# Patient Record
Sex: Male | Born: 1985 | Race: Black or African American | Hispanic: No | Marital: Single | Smoking: Heavy tobacco smoker
Health system: Southern US, Community
[De-identification: ages and names within clinical notes are randomized; demographics above are authoritative.]

## PROBLEM LIST (undated history)

## (undated) DIAGNOSIS — F1010 Alcohol abuse, uncomplicated: Secondary | ICD-10-CM

## (undated) DIAGNOSIS — F1910 Other psychoactive substance abuse, uncomplicated: Secondary | ICD-10-CM

## (undated) DIAGNOSIS — J45909 Unspecified asthma, uncomplicated: Secondary | ICD-10-CM

---
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
## 2008-09-03 ENCOUNTER — Emergency Department (HOSPITAL_COMMUNITY): Admission: EM | Admit: 2008-09-03 | Discharge: 2008-09-03 | Payer: Self-pay | Admitting: Emergency Medicine

## 2008-09-14 ENCOUNTER — Emergency Department (HOSPITAL_COMMUNITY): Admission: EM | Admit: 2008-09-14 | Discharge: 2008-09-14 | Payer: Self-pay | Admitting: Emergency Medicine

## 2010-03-17 ENCOUNTER — Emergency Department (HOSPITAL_COMMUNITY)
Admission: EM | Admit: 2010-03-17 | Discharge: 2010-03-17 | Payer: Self-pay | Source: Home / Self Care | Admitting: Emergency Medicine

## 2010-03-18 LAB — GC/CHLAMYDIA PROBE AMP, GENITAL
Chlamydia, DNA Probe: NEGATIVE
GC Probe Amp, Genital: NEGATIVE

## 2010-11-19 IMAGING — CR DG CHEST 2V
2 series · 2 of 2 positions shown · non-contrast
Comparison: None

CLINICAL DATA: Right chest pain with cough and congestion.  Smoker.
Question pneumonia.

CHEST - 2 VIEW

[view not recorded (1 of 2)]
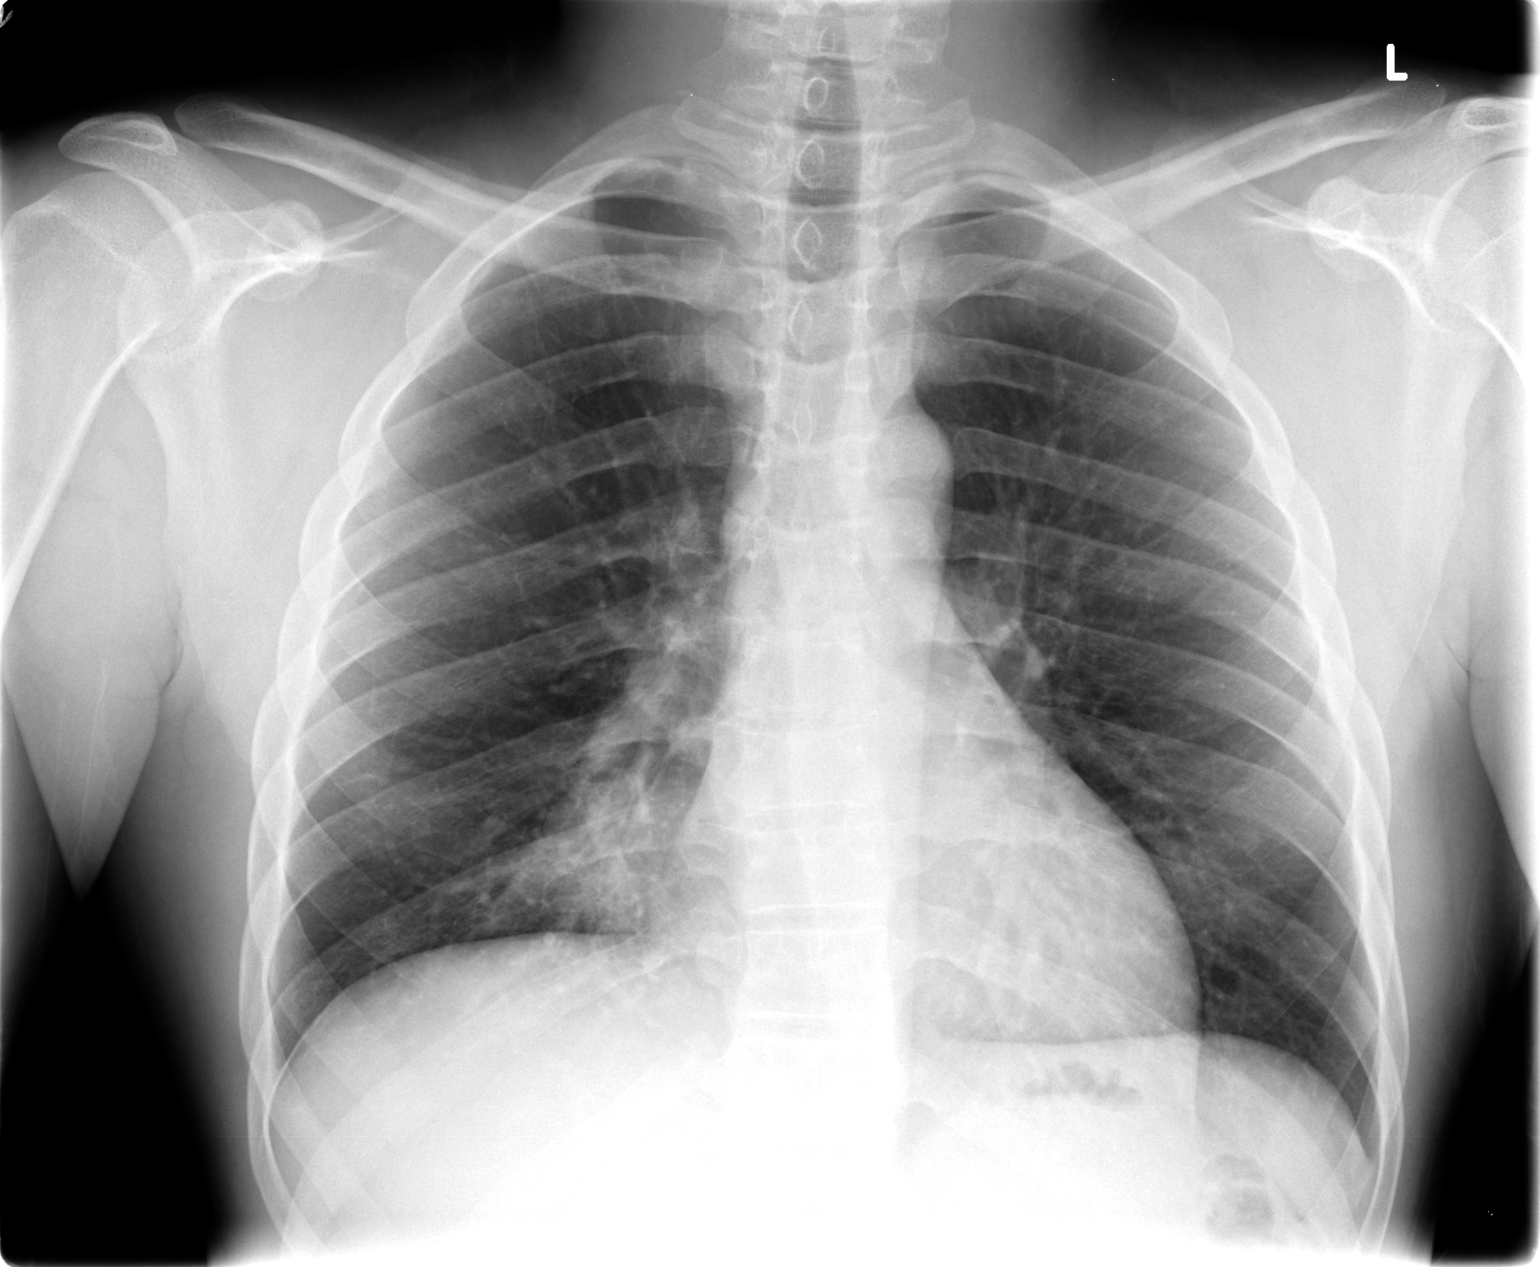

[view not recorded (2 of 2)]
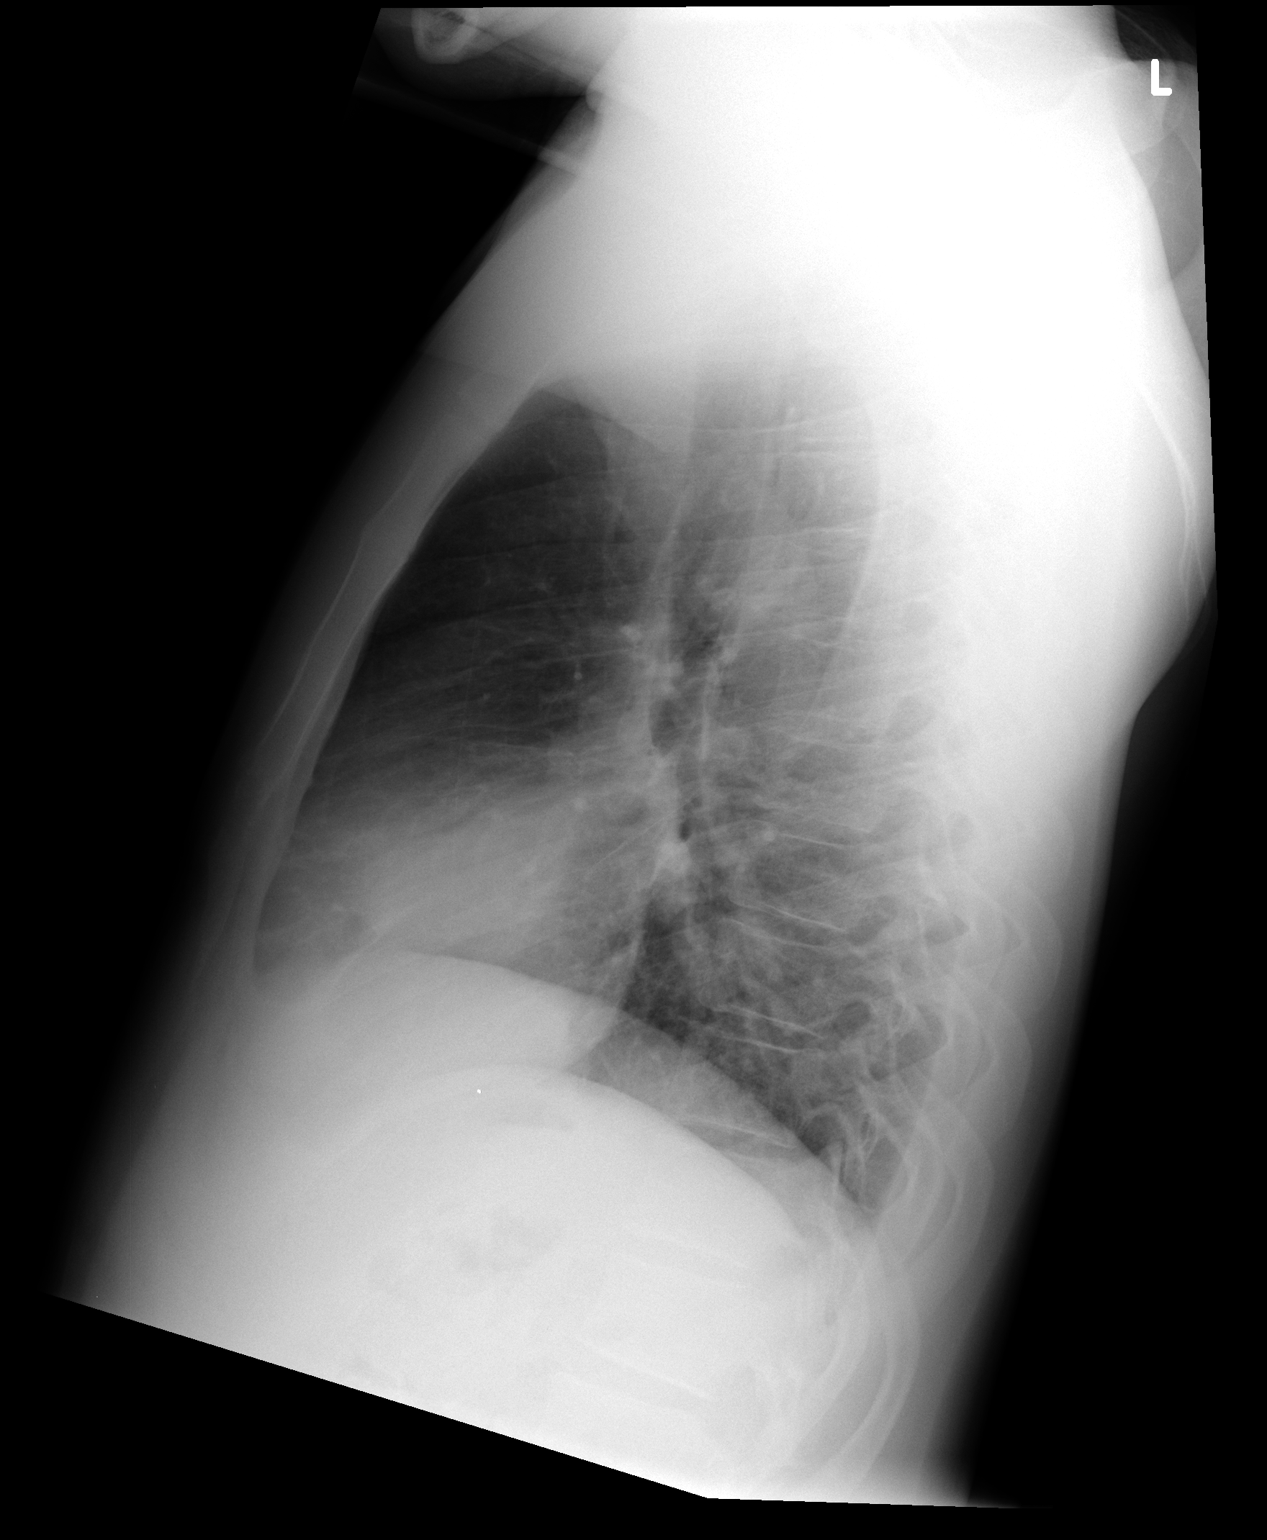

[2 of 2 positions shown; findings below may reference images not displayed]

FINDINGS: There is focal airspace disease or an ill-defined mass in
the right middle lobe, obscuring the right heart border on the
frontal examination.  The lungs are otherwise clear.  The heart
size and mediastinal contours otherwise appear normal.  There is no
pleural effusion or pneumothorax.
IMPRESSION: Probable right middle lobe pneumonia.  This should be followed to
radiographic clearing to exclude a mass or underlying lesion in
this location.

## 2019-02-16 ENCOUNTER — Emergency Department
Admission: EM | Admit: 2019-02-16 | Discharge: 2019-02-18 | Disposition: A | Discharge status: Other Acute Inpatient Hospital | Payer: Self-pay | Attending: Emergency Medicine | Admitting: Emergency Medicine

## 2019-02-16 ENCOUNTER — Other Ambulatory Visit: Payer: Self-pay

## 2019-02-16 DIAGNOSIS — R45851 Suicidal ideations: Secondary | ICD-10-CM | POA: Insufficient documentation

## 2019-02-16 DIAGNOSIS — F69 Unspecified disorder of adult personality and behavior: Secondary | ICD-10-CM

## 2019-02-16 DIAGNOSIS — R4689 Other symptoms and signs involving appearance and behavior: Secondary | ICD-10-CM | POA: Diagnosis present

## 2019-02-16 DIAGNOSIS — E876 Hypokalemia: Secondary | ICD-10-CM | POA: Insufficient documentation

## 2019-02-16 DIAGNOSIS — J45909 Unspecified asthma, uncomplicated: Secondary | ICD-10-CM | POA: Insufficient documentation

## 2019-02-16 DIAGNOSIS — F29 Unspecified psychosis not due to a substance or known physiological condition: Secondary | ICD-10-CM | POA: Diagnosis present

## 2019-02-16 DIAGNOSIS — F99 Mental disorder, not otherwise specified: Secondary | ICD-10-CM

## 2019-02-16 DIAGNOSIS — Z20828 Contact with and (suspected) exposure to other viral communicable diseases: Secondary | ICD-10-CM | POA: Insufficient documentation

## 2019-02-16 LAB — COMPREHENSIVE METABOLIC PANEL
ALT: 38 U/L (ref 0–44)
AST: 42 U/L — ABNORMAL HIGH (ref 15–41)
Albumin: 3.8 g/dL (ref 3.5–5.0)
Alkaline Phosphatase: 38 U/L (ref 38–126)
Anion gap: 14 (ref 5–15)
BUN: 10 mg/dL (ref 6–20)
CO2: 20 mmol/L — ABNORMAL LOW (ref 22–32)
Calcium: 9 mg/dL (ref 8.9–10.3)
Chloride: 108 mmol/L (ref 98–111)
Creatinine, Ser: 0.93 mg/dL (ref 0.61–1.24)
GFR calc Af Amer: >60 mL/min (ref >60)
GFR calc non Af Amer: >60 mL/min (ref >60)
Glucose, Bld: 99 mg/dL (ref 70–99)
Potassium: 3 mmol/L — ABNORMAL LOW (ref 3.5–5.1)
Sodium: 142 mmol/L (ref 135–145)
Total Bilirubin: 0.5 mg/dL (ref 0.3–1.2)
Total Protein: 6.9 g/dL (ref 6.5–8.1)

## 2019-02-16 LAB — CBC WITH DIFFERENTIAL/PLATELET
Abs Immature Granulocytes: 0.01 10*3/uL (ref 0.00–0.07)
Basophils Absolute: 0.1 10*3/uL (ref 0.0–0.1)
Basophils Relative: 1 %
Eosinophils Absolute: 0.2 10*3/uL (ref 0.0–0.5)
Eosinophils Relative: 4 %
HCT: 35.6 % — ABNORMAL LOW (ref 39.0–52.0)
Hemoglobin: 11.7 g/dL — ABNORMAL LOW (ref 13.0–17.0)
Immature Granulocytes: 0 %
Lymphocytes Relative: 46 %
Lymphs Abs: 2.4 10*3/uL (ref 0.7–4.0)
MCH: 26.8 pg (ref 26.0–34.0)
MCHC: 32.9 g/dL (ref 30.0–36.0)
MCV: 81.7 fL (ref 80.0–100.0)
Monocytes Absolute: 0.4 10*3/uL (ref 0.1–1.0)
Monocytes Relative: 9 %
Neutro Abs: 2 10*3/uL (ref 1.7–7.7)
Neutrophils Relative %: 40 %
Platelets: 194 10*3/uL (ref 150–400)
RBC: 4.36 MIL/uL (ref 4.22–5.81)
RDW: 14.3 % (ref 11.5–15.5)
WBC: 5.1 10*3/uL (ref 4.0–10.5)
nRBC: 0 % (ref 0.0–0.2)

## 2019-02-16 LAB — URINE DRUG SCREEN, QUALITATIVE (ARMC ONLY)
Amphetamines, Ur Screen: NOT DETECTED
Barbiturates, Ur Screen: NOT DETECTED
Benzodiazepine, Ur Scrn: NOT DETECTED
Cannabinoid 50 Ng, Ur ~~LOC~~: POSITIVE — AB
Cocaine Metabolite,Ur ~~LOC~~: POSITIVE — AB
MDMA (Ecstasy)Ur Screen: NOT DETECTED
Methadone Scn, Ur: NOT DETECTED
Opiate, Ur Screen: NOT DETECTED
Phencyclidine (PCP) Ur S: NOT DETECTED
Tricyclic, Ur Screen: NOT DETECTED

## 2019-02-16 LAB — SALICYLATE LEVEL: Salicylate Lvl: <7 mg/dL — ABNORMAL LOW (ref 7.0–30.0)

## 2019-02-16 LAB — ETHANOL: Alcohol, Ethyl (B): 160 mg/dL — ABNORMAL HIGH (ref <10)

## 2019-02-16 LAB — RESPIRATORY PANEL BY RT PCR (FLU A&B, COVID)
Influenza A by PCR: NEGATIVE
Influenza B by PCR: NEGATIVE
SARS Coronavirus 2 by RT PCR: NEGATIVE

## 2019-02-16 LAB — ACETAMINOPHEN LEVEL: Acetaminophen (Tylenol), Serum: <10 ug/mL — ABNORMAL LOW (ref 10–30)

## 2019-02-16 MED ORDER — POTASSIUM CHLORIDE CRYS ER 20 MEQ PO TBCR: 40.0000 meq | EXTENDED_RELEASE_TABLET | Freq: Once | ORAL | Status: DC

## 2019-02-16 MED ORDER — DIPHENHYDRAMINE HCL 50 MG/ML IJ SOLN
50.0000 mg | Freq: Once | INTRAMUSCULAR | Status: AC
  Administered 2019-02-16: 50 mg via INTRAVENOUS

## 2019-02-16 MED ORDER — LORAZEPAM 2 MG/ML IJ SOLN
2.0000 mg | Freq: Once | INTRAMUSCULAR | Status: AC
  Administered 2019-02-16: 2 mg via INTRAMUSCULAR

## 2019-02-16 MED ORDER — HALOPERIDOL LACTATE 5 MG/ML IJ SOLN: 5.0000 mg | Freq: Once | INTRAMUSCULAR | Status: AC

## 2019-02-16 MED ORDER — HALOPERIDOL LACTATE 5 MG/ML IJ SOLN
INTRAMUSCULAR | Status: AC
  Administered 2019-02-16: 5 mg via INTRAMUSCULAR
  Filled 2019-02-16: qty 1

## 2019-02-16 MED ORDER — LORAZEPAM 2 MG/ML IJ SOLN
INTRAMUSCULAR | Status: AC
  Administered 2019-02-16: 2 mg via INTRAMUSCULAR
  Filled 2019-02-16: qty 1

## 2019-02-16 MED ORDER — HALOPERIDOL LACTATE 5 MG/ML IJ SOLN
5.0000 mg | Freq: Once | INTRAMUSCULAR | Status: AC
  Administered 2019-02-16: 5 mg via INTRAMUSCULAR

## 2019-02-16 MED ORDER — LORAZEPAM 2 MG/ML IJ SOLN: 2.0000 mg | Freq: Once | INTRAMUSCULAR | Status: AC

## 2019-02-16 NOTE — ED Triage Notes (Signed)
Pt brought to rm 20 from registration desk with numerous officer and 2 relatives as pt is yelling and fighting. Threatening to kill everyone and himself. Pt family member tells this RN pt has not slept for days and has been "seeing things". Family unsure of hx of mental illness. Pt given medication in triage area prior to coming to rm 20. Dr Jari Pigg to room and ordered repeat of haldol and ativan  And will order restraints for pt safety.

## 2019-02-16 NOTE — ED Notes (Addendum)
Pt was released from wrist and ankle restraints by this tech per Lelon Frohlich, Therapist, sports.

## 2019-02-16 NOTE — ED Provider Notes (Signed)
Restraints were d/c from pt at 2030 but I forgot to delete the order. Deleted order now.    Vanessa Nolic, MD 02/16/19 2329

## 2019-02-16 NOTE — ED Notes (Addendum)
Pt dressed out by this tech and Colletta [place], NT-3. Pt belongings consisted of camo crocks, blue socks, white socks, necklace, hair bow, gray long sleeve shirt, white T-shirt, black jacket, blue underwear, gray pants.

## 2019-02-16 NOTE — ED Provider Notes (Signed)
[facility] Emergency Department Provider Note  ____________________________________________   First MD Initiated Contact with Patient 02/16/19 1905     (approximate)  I have reviewed the triage vital signs and the nursing notes.   HISTORY  Chief Complaint Aggressive Behavior    HPI Christian Fisher is a 33 y.o. male with no known medical history who comes in with psychiatric concern.  According to family patient is had some SI and HI.  Upon arrival here patient is agitated yelling and screaming.  Unable to get full HPI due to patient's agitation.  Per MOM:  Lost job, recent friend died, talking to self. FH of schizophrenia but he has never been diagnosed with anything. Does use THC and Cocaine. ETOH as well.  Medical: asthma   Prior to Admission medications   Not on File    Allergies Patient has no allergy information on record.  No family history on file.  Social History Cocaine, etoh, thc use.   Review of Systems Unable to get full review of system due to patient's agitation ________________________   PHYSICAL EXAM:  VITAL SIGNS: Blood pressure 125/75, pulse 71, temperature 99.1 F (37.3 C), temperature source Axillary, resp. rate 18, height 6\' 4"  (1.93 m), weight 104.3 kg, SpO2 97 %.   Constitutional: Yelling and screaming Eyes: Conjunctivae are normal.  No trauma Head: Atraumatic. Nose: No congestion/rhinnorhea. Mouth/Throat: Mucous membranes are moist.   Neck: No stridor. Trachea Midline. FROM Cardiovascular: Normal rate, regular rhythm. Grossly normal heart sounds.  Good peripheral circulation. Respiratory: Normal respiratory effort.  No retractions. Lungs CTAB. Gastrointestinal: Soft and nontender. No distention. No abdominal bruits.  Musculoskeletal: No lower extremity tenderness nor edema.  No joint effusions. Neurologic:  Normal speech and language. No gross focal neurologic deficits are appreciated.  Moving all  extremities Skin:  Skin is warm, dry and intact. No rash noted. Psychiatric: Yelling and screaming, unable to fully assess  GU: Deferred   ____________________________________________   LABS (all labs ordered are listed, but only abnormal results are displayed)  Labs Reviewed  CBC WITH DIFFERENTIAL/PLATELET  COMPREHENSIVE METABOLIC PANEL  URINE DRUG SCREEN, QUALITATIVE ([facility] ONLY)  ETHANOL  SALICYLATE LEVEL  ACETAMINOPHEN LEVEL   ____________________________________________   ED ECG REPORT I, , the attending physician, personally viewed and interpreted this ECG.  EKG is normal sinus rate of 91, no ST elevation, no T wave inversions, normal intervals ____________________________________________   INITIAL IMPRESSION / ASSESSMENT AND PLAN / ED COURSE  Christian Fisher was evaluated in Emergency Department on 02/16/2019 for the symptoms described in the history of present illness. He was evaluated in the context of the global COVID-19 pandemic, which necessitated consideration that the patient might be at risk for infection with the SARS-CoV-2 virus that causes COVID-19. Institutional protocols and algorithms that pertain to the evaluation of patients at risk for COVID-19 are in a state of rapid change based on information released by regulatory bodies including the CDC and federal and state organizations. These policies and algorithms were followed during the patient's care in the ED.    K low will give 40 PO.  IVC pt for concern for SI/HI/hallcunations.   Pt is without any acute medical complaints. No exam findings to suggest medical cause of current presentation. Will order psychiatric screening labs and discuss further w/ psychiatric service.  D/d includes but is not limited to psychiatric disease, behavioral/personality disorder, inadequate socioeconomic support, medical.  Based on HPI, exam, unremarkable labs, no concern for  acute medical problem at this time.  No rigidity, clonus, hyperthermia, focal neurologic deficit, diaphoresis, tachycardia, meningismus, ataxia, gait abnormality or other finding to suggest this visit represents a non-psychiatric problem. Screening labs reviewed.    Given this, pt medically cleared, to be dispositioned per Psych.        ____________________________________________   FINAL CLINICAL IMPRESSION(S) / ED DIAGNOSES   Final diagnoses:  Psychiatric complaint  Hypokalemia  Aggression      MEDICATIONS GIVEN DURING THIS VISIT:  Medications  potassium chloride SA (KLOR-CON) CR tablet 40 mEq (has no administration in time range)  LORazepam (ATIVAN) injection 2 mg (2 mg Intramuscular Given 02/16/19 1915)  haloperidol lactate (HALDOL) injection 5 mg (5 mg Intramuscular Given 02/16/19 1914)  haloperidol lactate (HALDOL) injection 5 mg (5 mg Intramuscular Given 02/16/19 1917)  LORazepam (ATIVAN) injection 2 mg (2 mg Intramuscular Given 02/16/19 1917)  diphenhydrAMINE (BENADRYL) injection 50 mg (50 mg Intravenous Given 02/16/19 1918)     ED Discharge Orders    None       Note:  This document was prepared using Dragon voice recognition software and may include unintentional dictation errors.   Vanessa [place], MD 02/16/19 2033

## 2019-02-16 NOTE — ED Notes (Signed)
Pt brought into ED by family. Pt family states that pt has not slept for day, he has been threatening to harm himself as well as others. Pt verbally and physically aggressive when entering ED. [employer] and security present. Pt was placed in handcuffs by [employer]. Pt continues to be verbally and physically aggressive trying to fight police and security. RN called for orders for medication and for a psych room. Verbal orders given by Dr. Joan Mayans for [place]. Pt was given injection of Benadryl 50 mg, Haldol 5 mg, and Ativan 2 mg IM by this RN and Teacher, English as a foreign language at [phone]. Pt continues to yell and cuss and scream at staff security, and [employer]. Pt family present with pt trying to calm pt and help get pt to psych room. Pt was met in room by Dr. Jari Pigg who IVC'ed pt.

## 2019-02-16 NOTE — BH Assessment (Signed)
[facility] Assessment Progress Note   Clinician unable to see patient at this time.  Pt given 5mg  haldol and 2mg  ativan at 19:15.  TTS to assess patient when he is alert and oriented.

## 2019-02-16 NOTE — ED Notes (Signed)
Pt up and ambulated to the restroom with two officers. Pt urinated all over the area around the toilet and in the toilet but this RN was able to catch urine in a cup for ordered UDS. Labeled and sent to the lab. Pt returned to the room and back to bed. Covered up and went to sleep.

## 2019-02-17 DIAGNOSIS — F29 Unspecified psychosis not due to a substance or known physiological condition: Secondary | ICD-10-CM | POA: Diagnosis present

## 2019-02-17 DIAGNOSIS — R4689 Other symptoms and signs involving appearance and behavior: Secondary | ICD-10-CM | POA: Diagnosis present

## 2019-02-17 MED ORDER — RISPERIDONE 1 MG PO TABS
1.0000 mg | ORAL_TABLET | Freq: Two times a day (BID) | ORAL | Status: DC
  Administered 2019-02-17 (×2): 1 mg via ORAL
  Filled 2019-02-17 (×2): qty 1

## 2019-02-17 MED ORDER — DIPHENHYDRAMINE HCL 50 MG/ML IJ SOLN: 50.0000 mg | Freq: Four times a day (QID) | INTRAMUSCULAR | Status: DC | PRN

## 2019-02-17 MED ORDER — DIPHENHYDRAMINE HCL 25 MG PO CAPS: 50.0000 mg | ORAL_CAPSULE | Freq: Four times a day (QID) | ORAL | Status: DC | PRN

## 2019-02-17 MED ORDER — LORAZEPAM 2 MG/ML IJ SOLN: 2.0000 mg | Freq: Four times a day (QID) | INTRAMUSCULAR | Status: DC | PRN

## 2019-02-17 MED ORDER — HALOPERIDOL 5 MG PO TABS: 5.0000 mg | ORAL_TABLET | Freq: Four times a day (QID) | ORAL | Status: DC | PRN

## 2019-02-17 MED ORDER — LORAZEPAM 2 MG PO TABS: 2.0000 mg | ORAL_TABLET | Freq: Four times a day (QID) | ORAL | Status: DC | PRN

## 2019-02-17 MED ORDER — HALOPERIDOL LACTATE 5 MG/ML IJ SOLN: 10.0000 mg | Freq: Four times a day (QID) | INTRAMUSCULAR | Status: DC | PRN

## 2019-02-17 NOTE — ED Notes (Signed)
Pt given meal tray.

## 2019-02-17 NOTE — ED Notes (Signed)
Hourly rounding reveals patient asleep in room. No complaints, stable, in no acute distress. Q15 minute rounds and monitoring via Security Cameras to continue. 

## 2019-02-17 NOTE — BH Assessment (Signed)
PATIENT CAN BE TRANSPORTED AFTER 9AM on 02/18/2019  Patient has been accepted to [facility].  Patient assigned to [place] [address] [place] is Dr. Merrie Roof.  Call report to [phone].  Representative was [employer].   ER Staff is aware of it:  [facility] ER Secretary  Dr. Heywood Iles , ER MD  Christian Fisher Patient's Nurse

## 2019-02-17 NOTE — [facility] Counselor (Signed)
Patient Referred to the following inpatient facilities...  [facility] [phone])  [facility] [phone])  [facility] [phone])  [facility] [phone])
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
## 2019-02-17 NOTE — Consult Note (Signed)
Provider unable to see patient at this time.  Pt given 5mg  haldol and 2mg  ativan at 19:15.  Psychiatry to assess patient when he is alert and oriented.

## 2019-02-17 NOTE — BH Assessment (Signed)
Additional Referral information for Psychiatric Hospitalization faxed to;   [facility] [phone]),   . [facility] [phone]- [phone]) [phone]  . [facility] [phone])  . [facility] [phone]),   . [facility] [phone] or [phone])  . [facility] [phone])   [facility] [phone] or [phone])   [place] [phone])

## 2019-02-17 NOTE — ED Notes (Signed)
Pt up independently to restroom 

## 2019-02-17 NOTE — ED Notes (Addendum)
Pt resting and not combative while changing clothes, drawing blood or doing vital signs. Dr Jari Pigg informed and agreeable with removing restraints. Will release restraints at this time. [facility] guards aware incase pt becomes aggressive again.

## 2019-02-17 NOTE — ED Notes (Signed)
Report to include Situation, Background, Assessment, and Recommendations received from Amy B. RN. Patient alert and oriented, warm and dry, in no acute distress. Patient denies SI, HI, AVH and pain. Patient made aware of Q15 minute rounds and security cameras for their safety. Patient instructed to come to me with needs or concerns. 

## 2019-02-17 NOTE — ED Provider Notes (Signed)
-----------------------------------------   7:17 AM on 02/17/2019 -----------------------------------------   Blood pressure (!) 100/54, pulse 77, temperature 98.9 F (37.2 C), temperature source Axillary, resp. rate 16, height 6\' 4"  (1.93 m), weight 104.3 kg, SpO2 100 %.  The patient is calm and cooperative at this time.  There have been no acute events since the last update.  Awaiting disposition plan from Behavioral Medicine and/or Social Work team(s).    Duffy Bruce, MD 02/17/19 [phone]

## 2019-02-17 NOTE — ED Notes (Signed)
Pt calm and cooperative on the unit. Pt allowed to use the phone.

## 2019-02-17 NOTE — ED Notes (Signed)
Pt does still have restraints at bedside in case needed when pt wakes up. Restraints NOT in use at this time.

## 2019-02-17 NOTE — ED Notes (Signed)
Hourly rounding reveals patient awake in room. No complaints, stable, in no acute distress. Q15 minute rounds and monitoring via Security Cameras to continue. 

## 2019-02-17 NOTE — ED Notes (Signed)
Psychiatry at bedside.

## 2019-02-17 NOTE — ED Notes (Signed)
Pt on phone with girlfriend at this time per Darnelle Maffucci, NP.

## 2019-02-17 NOTE — ED Notes (Signed)
TTS at bedside. Pt starts yelling and talking nonsense. 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 Pt walks to the bathroom but doesn't use the bathroom. Pt then goes back into the room and continues talking about his record deal. Pt then answer some questions appropriately and makes sense for a moment. Pt then lays back down and goes to sleep.

## 2019-02-17 NOTE — ED Notes (Signed)
Pt woke up said "good morning" and asked for pillows. Pt compliant with vitals. Pt given meal tray. Pt given two pillows. Pt calm and cooperative at this time.

## 2019-02-17 NOTE — BH Assessment (Signed)
Writer spoke with [place] Roderic Palau), currently reviewing patient.

## 2019-02-18 NOTE — ED Notes (Signed)
Hourly rounding reveals patient sleeping in room. No complaints, stable, in no acute distress. Q15 minute rounds and monitoring via Security Cameras to continue. 

## 2019-02-18 NOTE — ED Notes (Signed)
Pt discharged under IVC to [facility].  Report given to Durene Fruits., RN. VS stable. Pt denies SI/HI. All belongings given to officer.  Pt accepting of disposition.

## 2019-02-18 NOTE — ED Notes (Signed)
Pt told he would be leaving for [facility] for [facility].    Pt unhappy, but agreed to go with the [facility] officer.  No behavioral outbursts. Compliant with VS.

## 2019-02-18 NOTE — ED Provider Notes (Signed)
-----------------------------------------   4:37 AM on 02/18/2019 -----------------------------------------   Blood pressure 116/82, pulse 71, temperature 98.2 F (36.8 C), temperature source Oral, resp. rate 17, height 6\' 4"  (1.93 m), weight 104.3 kg, SpO2 99 %.  The patient had no acute events since last update.  Calm and cooperative at this time.  Disposition is pending per Psychiatry/Behavioral Medicine team recommendations.     Rudene Re, MD 02/18/19 [phone]

## 2019-03-06 ENCOUNTER — Other Ambulatory Visit: Payer: Self-pay

## 2019-03-06 DIAGNOSIS — F29 Unspecified psychosis not due to a substance or known physiological condition: Secondary | ICD-10-CM | POA: Insufficient documentation

## 2019-03-06 DIAGNOSIS — F17200 Nicotine dependence, unspecified, uncomplicated: Secondary | ICD-10-CM | POA: Insufficient documentation

## 2019-03-06 DIAGNOSIS — F1010 Alcohol abuse, uncomplicated: Secondary | ICD-10-CM | POA: Insufficient documentation

## 2019-03-06 DIAGNOSIS — Y906 Blood alcohol level of 120-199 mg/100 ml: Secondary | ICD-10-CM | POA: Insufficient documentation

## 2019-03-06 LAB — CBC
HCT: 37.9 % — ABNORMAL LOW (ref 39.0–52.0)
Hemoglobin: 12.7 g/dL — ABNORMAL LOW (ref 13.0–17.0)
MCH: 27.1 pg (ref 26.0–34.0)
MCHC: 33.5 g/dL (ref 30.0–36.0)
MCV: 80.8 fL (ref 80.0–100.0)
Platelets: 280 10*3/uL (ref 150–400)
RBC: 4.69 MIL/uL (ref 4.22–5.81)
RDW: 14.3 % (ref 11.5–15.5)
WBC: 10.6 10*3/uL — ABNORMAL HIGH (ref 4.0–10.5)
nRBC: 0 % (ref 0.0–0.2)

## 2019-03-06 LAB — COMPREHENSIVE METABOLIC PANEL
ALT: 19 U/L (ref 0–44)
AST: 24 U/L (ref 15–41)
Albumin: 4.5 g/dL (ref 3.5–5.0)
Alkaline Phosphatase: 52 U/L (ref 38–126)
Anion gap: 12 (ref 5–15)
BUN: 16 mg/dL (ref 6–20)
CO2: 22 mmol/L (ref 22–32)
Calcium: 9.3 mg/dL (ref 8.9–10.3)
Chloride: 104 mmol/L (ref 98–111)
Creatinine, Ser: 0.93 mg/dL (ref 0.61–1.24)
GFR calc Af Amer: >60 mL/min (ref >60)
GFR calc non Af Amer: >60 mL/min (ref >60)
Glucose, Bld: 101 mg/dL — ABNORMAL HIGH (ref 70–99)
Potassium: 3.8 mmol/L (ref 3.5–5.1)
Sodium: 138 mmol/L (ref 135–145)
Total Bilirubin: 0.6 mg/dL (ref 0.3–1.2)
Total Protein: 8.1 g/dL (ref 6.5–8.1)

## 2019-03-06 LAB — ACETAMINOPHEN LEVEL: Acetaminophen (Tylenol), Serum: <10 ug/mL — ABNORMAL LOW (ref 10–30)

## 2019-03-06 LAB — ETHANOL: Alcohol, Ethyl (B): 194 mg/dL — ABNORMAL HIGH (ref <10)

## 2019-03-06 LAB — SALICYLATE LEVEL: Salicylate Lvl: <7 mg/dL — ABNORMAL LOW (ref 7.0–30.0)

## 2019-03-06 NOTE — ED Triage Notes (Signed)
Pt presents via [facility] custody. Pt under IVC by family. Reports "people out to get him".

## 2019-03-07 ENCOUNTER — Emergency Department
Admission: EM | Admit: 2019-03-07 | Discharge: 2019-03-07 | Disposition: A | Discharge status: Home / Self Care | Payer: Self-pay | Attending: Student in an Organized Health Care Education/Training Program | Admitting: Student in an Organized Health Care Education/Training Program

## 2019-03-07 DIAGNOSIS — R4689 Other symptoms and signs involving appearance and behavior: Secondary | ICD-10-CM

## 2019-03-07 DIAGNOSIS — F319 Bipolar disorder, unspecified: Secondary | ICD-10-CM | POA: Insufficient documentation

## 2019-03-07 MED ORDER — LORAZEPAM 1 MG PO TABS
1.0000 mg | ORAL_TABLET | Freq: Once | ORAL | Status: AC
  Administered 2019-03-07: 1 mg via ORAL
  Filled 2019-03-07: qty 1

## 2019-03-07 NOTE — Consult Note (Signed)
[facility] Face-to-Face Psychiatry Consult   Reason for Consult:  Bipolar disorder Referring Physician: Siadecki Patient Identification: Christian Fisher MRN:  [id number] Principal Diagnosis: <principal problem not specified> Diagnosis:  Active Problems:   * No active hospital problems. *   Total Time spent with patient: 30 minutes  Subjective:   Christian Fisher is a 34 y.o. male patient who presented to the emergency department for reported abnormal behavior.  HPI:  Christian Fisher is a 34 y.o. male who presented to the emergency department last night. Upon arrival the patient was upset about his situation and felt that he was placed under IVC for no good reason. He was offered medication to help him relax, which he took. He was left overnight to sleep and was reassessed this morning. Upon speaking with him this morning he continues to express that his IVC was done unnecessarily. He adamantly denies any symptoms. He denies SI and HI, hallucinations, mania, or symptoms of psychosis. He expresses that he would like to go home. Goes on to say further that he was just released from the psych hospital approximately 2 weeks prior.  He states that he was at [facility] for what he believed to be bipolar disorder and was prescribed medications and set up with an outpatient appointment.  Patient states that he was not prescribed mood stabilizers and only benzos to help with his anxiety.  He goes on to further say that he was completely stabilized at the time of his discharge and is feeling more stable now.  He is unsure why his aunts would have called IVC on him but he thinks it has to do with him not answering his phone.  Patient states that he often does not answer his phone because he is busy with other tasks.  He denies any concerns about his mental health and reports feeling safe going home today.  Past Psychiatric History: Patient reports recent inpatient psychiatric hospitalization where he was  diagnosed with bipolar disorder. He received treatment and was referred for outpatient services. He has been compliant with his medications and follow up since discharge and will follow up with [facility] to refill his medications.   Risk to Self: Suicidal Ideation: No Suicidal Intent: No Is patient at risk for suicide?: No, but patient needs Medical Clearance Suicidal Plan?: No Access to Means: No How many times?: 0 Risk to Others: Homicidal Ideation: No Thoughts of Harm to Others: No Current Homicidal Intent: No Current Homicidal Plan: No Access to Homicidal Means: No History of harm to others?: No Assessment of Violence: None Noted Does patient have access to weapons?: No Criminal Charges Pending?: No Does patient have a court date: No Prior Inpatient Therapy: Prior Inpatient Therapy: No Prior Outpatient Therapy: Prior Outpatient Therapy: No Does patient have an ACCT team?: No Does patient have Intensive In-House Services?  : No Does patient have [facility] services? : No Does patient have P4CC services?: No  Past Medical History: History reviewed. No pertinent past medical history. History reviewed. No pertinent surgical history. Family History: History reviewed. No pertinent family history. Family Psychiatric  History: Unknown Social History:  Social History   Substance and Sexual Activity  Alcohol Use None     Social History   Substance and Sexual Activity  Drug Use Not on file    Social History   Socioeconomic History  . Marital status: Single    Spouse name: Not on file  . Number of children: Not on file  . Years of  education: Not on file  . Highest education level: Not on file  Occupational History  . Not on file  Tobacco Use  . Smoking status: Heavy Tobacco Smoker  . Smokeless tobacco: Never Used  Substance and Sexual Activity  . Alcohol use: Not on file  . Drug use: Not on file  . Sexual activity: Not on file  Other Topics Concern  . Not on file  Social  History Narrative  . Not on file   Social Determinants of Health   Financial Resource Strain:   . Difficulty of Paying Living Expenses: Not on file  Food Insecurity:   . Worried About Charity fundraiser in the Last Year: Not on file  . Ran Out of Food in the Last Year: Not on file  Transportation Needs:   . Lack of Transportation (Medical): Not on file  . Lack of Transportation (Non-Medical): Not on file  Physical Activity:   . Days of Exercise per Week: Not on file  . Minutes of Exercise per Session: Not on file  Stress:   . Feeling of Stress : Not on file  Social Connections:   . Frequency of Communication with Friends and Family: Not on file  . Frequency of Social Gatherings with Friends and Family: Not on file  . Attends Religious Services: Not on file  . Active Member of Clubs or Organizations: Not on file  . Attends Archivist Meetings: Not on file  . Marital Status: Not on file   Additional Social History: Lives with his girlfriend. Works in Biomedical engineer. Recently out of prison.     Allergies:  No Known Allergies  Labs:  Results for orders placed or performed during the hospital encounter of 03/07/19 (from the past 48 hour(s))  Comprehensive metabolic panel     Status: Abnormal   Collection Time: 03/06/19 10:02 PM  Result Value Ref Range   Sodium 138 135 - 145 mmol/L   Potassium 3.8 3.5 - 5.1 mmol/L   Chloride 104 98 - 111 mmol/L   CO2 22 22 - 32 mmol/L   Glucose, Bld 101 (H) 70 - 99 mg/dL   BUN 16 6 - 20 mg/dL   Creatinine, Ser 0.93 0.61 - 1.24 mg/dL   Calcium 9.3 8.9 - 10.3 mg/dL   Total Protein 8.1 6.5 - 8.1 g/dL   Albumin 4.5 3.5 - 5.0 g/dL   AST 24 15 - 41 U/L   ALT 19 0 - 44 U/L   Alkaline Phosphatase 52 38 - 126 U/L   Total Bilirubin 0.6 0.3 - 1.2 mg/dL   GFR calc non Af Amer >60 >60 mL/min   GFR calc Af Amer >60 >60 mL/min   Anion gap 12 5 - 15    Comment: Performed at [facility], [address], [place], [place] [address]   Ethanol     Status: Abnormal   Collection Time: 03/06/19 10:02 PM  Result Value Ref Range   Alcohol, Ethyl (B) 194 (H) <10 mg/dL    Comment: (NOTE) Lowest detectable limit for serum alcohol is 10 mg/dL. For medical purposes only. Performed at [facility], [address] [place], [place] [address]   Salicylate level     Status: Abnormal   Collection Time: 03/06/19 10:02 PM  Result Value Ref Range   Salicylate Lvl <8.2 (L) 7.0 - 30.0 mg/dL    Comment: Performed at [facility], [address] [place], [place] [address]  Acetaminophen level  Status: Abnormal   Collection Time: 03/06/19 10:02 PM  Result Value Ref Range   Acetaminophen (Tylenol), Serum <10 (L) 10 - 30 ug/mL    Comment: (NOTE) Therapeutic concentrations vary significantly. A range of 10-30 ug/mL  may be an effective concentration for many patients. However, some  are best treated at concentrations outside of this range. Acetaminophen concentrations >150 ug/mL at 4 hours after ingestion  and >50 ug/mL at 12 hours after ingestion are often associated with  toxic reactions. Performed at [facility], [address] [place], [place] [address]   cbc     Status: Abnormal   Collection Time: 03/06/19 10:02 PM  Result Value Ref Range   WBC 10.6 (H) 4.0 - 10.5 K/uL   RBC 4.69 4.22 - 5.81 MIL/uL   Hemoglobin 12.7 (L) 13.0 - 17.0 g/dL   HCT 93.2 (L) 35.5 - 73.2 %   MCV 80.8 80.0 - 100.0 fL   MCH 27.1 26.0 - 34.0 pg   MCHC 33.5 30.0 - 36.0 g/dL   RDW 20.2 54.2 - 70.6 %   Platelets 280 150 - 400 K/uL   nRBC 0.0 0.0 - 0.2 %    Comment: Performed at [facility], [address] [place], [place] [address]    No current facility-administered medications for this encounter.   No current outpatient medications on file.    Musculoskeletal: Strength & Muscle Tone: within normal limits Gait & Station: normal Patient leans: N/A  Psychiatric Specialty Exam: Physical Exam   Nursing note and vitals reviewed. Constitutional: He is oriented to person, place, and time. He appears well-developed and well-nourished.  Cardiovascular: Normal rate.  Respiratory: Effort normal.  Neurological: He is alert and oriented to person, place, and time.    Review of Systems  Constitutional: Negative for fever.  HENT: Negative for ear pain.   Respiratory: Negative for shortness of breath.   Neurological: Negative for speech difficulty.  Psychiatric/Behavioral: Negative for agitation, behavioral problems, confusion, decreased concentration, dysphoric mood, hallucinations, self-injury, sleep disturbance and suicidal ideas. The patient is nervous/anxious. The patient is not hyperactive.     Blood pressure (!) 141/100, pulse 96, temperature 98.7 F (37.1 C), temperature source Oral, resp. rate 17, SpO2 98 %.There is no height or weight on file to calculate BMI.  General Appearance: Fairly Groomed  Eye Contact:  Good  Speech:  Fast  Volume:  Increased  Mood:  Euthymic  Affect:  Appropriate  Thought Process:  Coherent  Orientation:  Full (Time, Place, and Person)  Thought Content:  Logical  Suicidal Thoughts:  No  Homicidal Thoughts:  No  Memory:  Recent;   Good  Judgement:  Fair  Insight:  Fair  Psychomotor Activity:  Normal  Concentration:  Concentration: Good  Recall:  Good  Fund of Knowledge:  Fair  Language:  Good  Akathisia:  No  Handed:  Right  AIMS (if indicated):     Assets:  Communication Skills Desire for Improvement Housing Intimacy Physical Health Resilience Social Support Talents/Skills  ADL's:  Intact  Cognition:  WNL  Sleep:        Treatment Plan Summary: Mr. Martindale is a 34 y.o. male who presented to the ED under IVC by his aunts. The patient does not exhibit abnormal behavior at this time. There is no evidence of psychosis, SI, HI.  Patient has a appropriate follow-up and resources in the community.  The patient will be discharged  home.  Diagnosis: Bipolar disorder   Disposition: No  evidence of imminent risk to self or others at present.   Patient does not meet criteria for psychiatric inpatient admission. Supportive therapy provided about ongoing stressors. Discussed crisis plan, support from social network, calling 911, coming to the Emergency Department, and calling Suicide Hotline.  Clement Sayres, MD 03/07/2019 11:23 AM

## 2019-03-07 NOTE — ED Notes (Signed)
Patient talking to TTS via tele-video.

## 2019-03-07 NOTE — ED Notes (Signed)
He has stepped into the hallway and is talking with the officer  - pt reporting  "I am ready to go home - I am not going to hurt myself"   Pt reassured by officer  Pt pending reassessment from psych  He is IVC

## 2019-03-07 NOTE — ED Notes (Signed)
Pt. States "I spent a week at [employer]".  Pt. States, "because I did not answer my phone, they had police come pick me up".  Pt. Appears to be manic, talking about many different jobs he has.  Pt. Is calm and cooperative at this time.  Pt. Is under IVC.

## 2019-03-07 NOTE — ED Notes (Signed)
Gave Breakfast tray with juice.

## 2019-03-07 NOTE — ED Notes (Signed)
He can be heard yelling while talking on the phone  - cursing at his mother  " momma - what the hell you got going on - cause I didn't answer my fucking phone  - I am 34 yo and I can take care of myself"

## 2019-03-07 NOTE — ED Provider Notes (Signed)
-----------------------------------------   10:18 AM on 03/07/2019 -----------------------------------------  Per Dr. Cindi Carbon from psychiatry, the patient has been cleared for discharge.  He has rescinded the IVC.  Discharge instructions and return precautions provided.   Dionne Bucy, MD 03/07/19 1018

## 2019-03-07 NOTE — ED Notes (Signed)
ED BHU  Is the patient under IVC or is there intent for IVC: Yes.   Is the patient medically cleared: Yes.   Is there vacancy in the ED BHU: Yes.   Is the population mix appropriate for patient: Yes.   Is the patient awaiting placement in inpatient or outpatient setting:  Has the patient had a psychiatric consult:  psych consult is pending  Survey of unit performed for contraband, proper placement and condition of furniture, tampering with fixtures in bathroom, shower, and each patient room: Yes.  ; Findings:  APPEARANCE/BEHAVIOR Calm and cooperative NEURO ASSESSMENT Orientation: oriented x3  Denies pain Hallucinations: No.None noted (Hallucinations) denies  Speech: Normal Gait: normal RESPIRATORY ASSESSMENT Even  Unlabored respirations  CARDIOVASCULAR ASSESSMENT Pulses equal   regular rate  Skin warm and dry   GASTROINTESTINAL ASSESSMENT no GI complaint EXTREMITIES Full ROM  PLAN OF CARE Provide calm/safe environment. Vital signs assessed twice daily. ED BHU Assessment once each 12-hour shift. Assure the ED provider has rounded once each shift. Provide and encourage hygiene. Provide redirection as needed. Assess for escalating behavior; address immediately and inform ED provider.  Assess family dynamic and appropriateness for visitation as needed: Yes.  ; If necessary, describe findings:  Educate the patient/family about BHU procedures/visitation: Yes.  ; If necessary, describe findings:

## 2019-03-07 NOTE — ED Notes (Signed)
BEHAVIORAL HEALTH ROUNDING Patient sleeping: No. Patient alert and oriented: yes Behavior appropriate: Yes.  ; If no, describe:  Nutrition and fluids offered: yes Toileting and hygiene offered: Yes  Sitter present: q15 minute observations  Law enforcement present: Yes  ODS

## 2019-03-07 NOTE — ED Notes (Signed)
Pt states, "hell the fuck no" when this tech attempted to swab pt for Baron Sane, RN notified. Pt continues to speak as this writer walks away stating, "This is terrible and I'm here for no reason. I just got the fuck out of [facility] and they're mad I didn't answer my phone".

## 2019-03-07 NOTE — Progress Notes (Signed)
Christian Fisher is a 34 y.o. male presents to the ER for evaluation of abnormal behavior.  The patient comes in under IVC by the police. The patient was medicated due to his behaviors; therefore, unable to assess him.

## 2019-03-07 NOTE — ED Notes (Signed)
Patient observed lying in bed with eyes closed  Even, unlabored respirations observed   NAD pt appears to be sleeping  I will continue to monitor along with every 15 minute visual observations     

## 2019-03-07 NOTE — Discharge Instructions (Addendum)
Follow-up with your regular medical providers.  Return to the ER immediately for any new or acute medical or mental health concerns.

## 2019-03-07 NOTE — ED Notes (Signed)
BEHAVIORAL HEALTH ROUNDING Patient sleeping: No. Patient alert and oriented: yes Behavior appropriate: Yes.  ; If no, describe:  Nutrition and fluids offered: yes Toileting and hygiene offered: Yes  Sitter present: q15 minute observations  Law enforcement present: Yes  [employer]   ENVIRONMENTAL ASSESSMENT Potentially harmful objects out of patient reach: Yes.   Personal belongings secured: Yes.   Patient dressed in hospital provided attire only: Yes.   Plastic bags out of patient reach: Yes.   Patient care equipment (cords, cables, call bells, lines, and drains) shortened, removed, or accounted for: Yes.   Equipment and supplies removed from bottom of stretcher: Yes.   Potentially toxic materials out of patient reach: Yes.   Sharps container removed or out of patient reach: Yes.   

## 2019-03-07 NOTE — BH Assessment (Signed)
Assessment Note  Christian Fisher is an 34 y.o. male. Who  presents via [place] custody. Pt under IVC by family. Reports "people out to get him". Pt awakened to voice and was agreeable to complete assessment. Much of the pts  time and timelines were inconsistent although, Pt able to provide history.Pt presents as irritable and with a flight of ideas but was cooperative throughout the evaluation. When ascked what prompted his presentation to the hospital he states. "because I did not answer my phone, they had police come pick me up". He shares that his aunts are upset because they don't understand him. Pt is grandiose and expansive. Pt shares that he lives with " My Old Karma Greaser" Pt was rambling throughout the assessment and often went off into tangents giving inconsistent responses including; I am wearing a $400 outfit, I am going viral,My family don't even think I rap" He shares that he was recently release form [facility]. He states that he was told to go to [facility] and planned on going to participate on Friday. Pt states " they gave me sleeping medication and I sleep now." It is unclear if there where other medications provided. Pt alludes to the fact that he is often intoxicated or high but is uncomfortable elaborating on this. He states " I stay Lit." Pt. denies any suicidal ideation, plan or intent. Pt. denies the presence of any auditory or visual hallucinations at this time. Patient denies any other medical complaints.   Diagnosis: Psychosis   Past Medical History: History reviewed. No pertinent past medical history.  History reviewed. No pertinent surgical history.  Family History: History reviewed. No pertinent family history.  Social History:  reports that he has been smoking. He has never used smokeless tobacco. No history on file for alcohol and drug.  Additional Social History:  Alcohol / Drug Use Pain Medications: SEE MAR Prescriptions: SEE MAR Over the Counter: SEE MAR History of alcohol /  drug use?: Yes Longest period of sobriety (when/how long): Unknown  CIWA: CIWA-Ar BP: (!) 152/102 Pulse Rate: (!) 115 COWS:    Allergies: No Known Allergies  Home Medications: (Not in a hospital admission)   OB/GYN Status:  No LMP for male patient.  General Assessment Data Location of Assessment: [facility] ED TTS Assessment: In system Is this a Tele or Face-to-Face Assessment?: Tele Assessment Is this an Initial Assessment or a Re-assessment for this encounter?: Initial Assessment Patient Accompanied by:: N/A Language Other than English: No Living Arrangements: Other (Comment) What gender do you identify as?: Male Marital status: Single Living Arrangements: Spouse/significant other Can pt return to current living arrangement?: Yes Admission Status: Involuntary Petitioner: ED Attending Is patient capable of signing voluntary admission?: No Referral Source: Self/Family/Friend Insurance type: None  Medical Screening Exam [facility] Walk-in ONLY) Medical Exam completed: Yes  Crisis Care Plan Living Arrangements: Spouse/significant other Legal Guardian: (Self) Name of Psychiatrist: None  Name of Therapist: None  Education Status Is patient currently in school?: No Is the patient employed, unemployed or receiving disability?: Unemployed  Risk to self with the past 6 months Suicidal Ideation: No Has patient been a risk to self within the past 6 months prior to admission? : No Suicidal Intent: No Has patient had any suicidal intent within the past 6 months prior to admission? : No Is patient at risk for suicide?: No, but patient needs Medical Clearance Suicidal Plan?: No Has patient had any suicidal plan within the past 6 months prior to admission? : No Access to  Means: No Previous Attempts/Gestures: No How many times?: 0 Family Suicide History: Unknown Recent stressful life event(s): Job Loss, Loss (Comment) Persecutory voices/beliefs?: No Depression: No Substance abuse  history and/or treatment for substance abuse?: Yes Suicide prevention information given to non-admitted patients: Not applicable  Risk to Others within the past 6 months Homicidal Ideation: No Does patient have any lifetime risk of violence toward others beyond the six months prior to admission? : No Thoughts of Harm to Others: No Current Homicidal Intent: No Current Homicidal Plan: No Access to Homicidal Means: No History of harm to others?: No Assessment of Violence: None Noted Does patient have access to weapons?: No Criminal Charges Pending?: No Does patient have a court date: No Is patient on probation?: No  Psychosis Hallucinations: None noted Delusions: Grandiose  Mental Status Report Appearance/Hygiene: In scrubs Eye Contact: Fair Motor Activity: Freedom of movement Speech: Aggressive, Argumentative, Pressured Level of Consciousness: Alert Mood: Irritable Affect: Angry, Anxious, Irritable Anxiety Level: Moderate Thought Processes: Circumstantial, Flight of Ideas Judgement: Impaired Orientation: Person, Place, Time, Situation Obsessive Compulsive Thoughts/Behaviors: None  Cognitive Functioning Concentration: Normal Memory: Recent Intact, Remote Intact Is patient IDD: No Insight: Poor Impulse Control: Poor Appetite: Fair Have you had any weight changes? : No Change Sleep: Increased Total Hours of Sleep: 6 Vegetative Symptoms: None  ADLScreening [facility]) Patient's cognitive ability adequate to safely complete daily activities?: Yes Patient able to express need for assistance with ADLs?: Yes Independently performs ADLs?: Yes (appropriate for developmental age)  Prior Inpatient Therapy Prior Inpatient Therapy: No  Prior Outpatient Therapy Prior Outpatient Therapy: No Does patient have an ACCT team?: No Does patient have Intensive In-House Services?  : No Does patient have [facility] services? : No Does patient have P4CC services?: No  ADL  Screening (condition at time of admission) Patient's cognitive ability adequate to safely complete daily activities?: Yes Patient able to express need for assistance with ADLs?: Yes Independently performs ADLs?: Yes (appropriate for developmental age)       Abuse/Neglect Assessment (Assessment to be complete while patient is alone) Abuse/Neglect Assessment Can Be Completed: Yes Physical Abuse: Denies Verbal Abuse: Denies Sexual Abuse: Denies Exploitation of patient/patient's resources: Denies Self-Neglect: Denies Values / Beliefs Cultural Requests During Hospitalization: None Spiritual Requests During Hospitalization: None Consults Spiritual Care Consult Needed: No Transition of Care Team Consult Needed: No Advance Directives (For Healthcare) Does Patient Have a Medical Advance Directive?: No          Disposition:  Disposition Initial Assessment Completed for this Encounter: Yes Patient referred to: Other (Comment)(Consult with Psych MD/NP)  On Site Evaluation by:   Reviewed with Physician:    Laretta Alstrom 03/07/2019 6:14 AM

## 2019-03-07 NOTE — ED Provider Notes (Signed)
[facility] Emergency Department Provider Note    First MD Initiated Contact with Patient 03/07/19 [phone]     (approximate)  I have reviewed the triage vital signs and the nursing notes.   HISTORY  Chief Complaint Psychiatric Evaluation    HPI Christian Fisher is a 34 y.o. male presents the ER for evaluation of abnormal behavior.  Patient comes in under IVC be police.  Reportedly having labile mood swings and was threatening harm himself and others according to family where the called police.  States that also has been off of his medications was recently discharged from psychiatric hospital.  States that he is "going viral. "   Denies any pain or discomfort.     History reviewed. No pertinent past medical history. History reviewed. No pertinent family history. History reviewed. No pertinent surgical history. Patient Active Problem List   Diagnosis Date Noted  . Aggression 02/17/2019  . Psychosis ([place]) 02/17/2019      Prior to Admission medications   Not on File    Allergies Patient has no known allergies.    Social History Social History   Tobacco Use  . Smoking status: Heavy Tobacco Smoker  . Smokeless tobacco: Never Used  Substance Use Topics  . Alcohol use: Not on file  . Drug use: Not on file    Review of Systems Patient denies headaches, rhinorrhea, blurry vision, numbness, shortness of breath, chest pain, edema, cough, abdominal pain, nausea, vomiting, diarrhea, dysuria, fevers, rashes or hallucinations unless otherwise stated above in HPI. ____________________________________________   PHYSICAL EXAM:  VITAL SIGNS: Vitals:   03/06/19 2147  BP: (!) 152/102  Pulse: (!) 115  Resp: 14  Temp: 99.2 F (37.3 C)  SpO2: 97%    Constitutional: Alert, hypervigilant, intoxicated appearing  Eyes: Conjunctivae are normal.  Head: Atraumatic. Nose: No congestion/rhinnorhea. Mouth/Throat: Mucous membranes are moist.   Neck: No  stridor. Painless ROM.  Cardiovascular: Normal rate, regular rhythm. Grossly normal heart sounds.  Good peripheral circulation. Respiratory: Normal respiratory effort.  No retractions. Lungs CTAB. Gastrointestinal: Soft and nontender. No distention. No abdominal bruits. No CVA tenderness. Genitourinary:  Musculoskeletal: No lower extremity tenderness nor edema.  No joint effusions. Neurologic:  Normal speech and language. No gross focal neurologic deficits are appreciated. No facial droop Skin:  Skin is warm, dry and intact. No rash noted. Psychiatric: pressured speech, intoxicated  ____________________________________________   LABS (all labs ordered are listed, but only abnormal results are displayed)  Results for orders placed or performed during the hospital encounter of 03/07/19 (from the past 24 hour(s))  Comprehensive metabolic panel     Status: Abnormal   Collection Time: 03/06/19 10:02 PM  Result Value Ref Range   Sodium 138 135 - 145 mmol/L   Potassium 3.8 3.5 - 5.1 mmol/L   Chloride 104 98 - 111 mmol/L   CO2 22 22 - 32 mmol/L   Glucose, Bld 101 (H) 70 - 99 mg/dL   BUN 16 6 - 20 mg/dL   Creatinine, Ser 0.93 0.61 - 1.24 mg/dL   Calcium 9.3 8.9 - 10.3 mg/dL   Total Protein 8.1 6.5 - 8.1 g/dL   Albumin 4.5 3.5 - 5.0 g/dL   AST 24 15 - 41 U/L   ALT 19 0 - 44 U/L   Alkaline Phosphatase 52 38 - 126 U/L   Total Bilirubin 0.6 0.3 - 1.2 mg/dL   GFR calc non Af Amer >60 >60 mL/min   GFR calc Af Amer >  60 >60 mL/min   Anion gap 12 5 - 15  Ethanol     Status: Abnormal   Collection Time: 03/06/19 10:02 PM  Result Value Ref Range   Alcohol, Ethyl (B) 194 (H) <10 mg/dL  Salicylate level     Status: Abnormal   Collection Time: 03/06/19 10:02 PM  Result Value Ref Range   Salicylate Lvl <7.0 (L) 7.0 - 30.0 mg/dL  Acetaminophen level     Status: Abnormal   Collection Time: 03/06/19 10:02 PM  Result Value Ref Range   Acetaminophen (Tylenol), Serum <10 (L) 10 - 30 ug/mL  cbc      Status: Abnormal   Collection Time: 03/06/19 10:02 PM  Result Value Ref Range   WBC 10.6 (H) 4.0 - 10.5 K/uL   RBC 4.69 4.22 - 5.81 MIL/uL   Hemoglobin 12.7 (L) 13.0 - 17.0 g/dL   HCT 16.1 (L) 09.6 - 04.5 %   MCV 80.8 80.0 - 100.0 fL   MCH 27.1 26.0 - 34.0 pg   MCHC 33.5 30.0 - 36.0 g/dL   RDW 40.9 81.1 - 91.4 %   Platelets 280 150 - 400 K/uL   nRBC 0.0 0.0 - 0.2 %   ____________________________________________ ____________________________________________  RADIOLOGY   ____________________________________________   PROCEDURES  Procedure(s) performed:  Procedures    Critical Care performed: no ____________________________________________   INITIAL IMPRESSION / ASSESSMENT AND PLAN / ED COURSE  Pertinent labs & imaging results that were available during my care of the patient were reviewed by me and considered in my medical decision making (see chart for details).   DDX: Psychosis, delirium, medication effect, noncompliance, polysubstance abuse, Si, Hi, depression   Christian Fisher is a 34 y.o. who presents to the ED with for evaluation of SI.  Patient has psych history with recent DC from psychiatric facility..  Laboratory testing was ordered to evaluation for underlying electrolyte derangement or signs of underlying organic pathology to explain today's presentation.  Based on history and physical and laboratory evaluation, it appears that the patient's presentation is 2/2 underlying psychiatric disorder and will require further evaluation and management by inpatient psychiatry.  Patient was  made an IVC due to manic behavior.  Disposition pending psychiatric evaluation.      The patient was evaluated in Emergency Department today for the symptoms described in the history of present illness. He/she was evaluated in the context of the global COVID-19 pandemic, which necessitated consideration that the patient might be at risk for infection with the SARS-CoV-2 virus that  causes COVID-19. Institutional protocols and algorithms that pertain to the evaluation of patients at risk for COVID-19 are in a state of rapid change based on information released by regulatory bodies including the CDC and federal and state organizations. These policies and algorithms were followed during the patient's care in the ED.  As part of my medical decision making, I reviewed the following data within the electronic MEDICAL RECORD NUMBER Nursing notes reviewed and incorporated, Labs reviewed, notes from prior ED visits and [place] Controlled Substance Database   ____________________________________________   FINAL CLINICAL IMPRESSION(S) / ED DIAGNOSES  Final diagnoses:  Psychosis, unspecified psychosis type (HCC)  Alcohol abuse      NEW MEDICATIONS STARTED DURING THIS VISIT:  New Prescriptions   No medications on file     Note:  This document was prepared using Dragon voice recognition software and may include unintentional dictation errors.    Willy Eddy, MD 03/07/19 [phone]

## 2020-12-09 ENCOUNTER — Telehealth: Payer: Self-pay | Admitting: Nutrition

## 2020-12-09 NOTE — Telephone Encounter (Signed)
Opened in error

## 2021-05-27 ENCOUNTER — Other Ambulatory Visit (HOSPITAL_COMMUNITY): Payer: Self-pay | Admitting: *Deleted

## 2021-05-27 ENCOUNTER — Observation Stay (HOSPITAL_BASED_OUTPATIENT_CLINIC_OR_DEPARTMENT_OTHER): Payer: Self-pay

## 2021-05-27 ENCOUNTER — Encounter (HOSPITAL_COMMUNITY): Payer: Self-pay | Admitting: Emergency Medicine

## 2021-05-27 ENCOUNTER — Emergency Department (HOSPITAL_COMMUNITY): Payer: Self-pay

## 2021-05-27 ENCOUNTER — Observation Stay (HOSPITAL_COMMUNITY)
Admission: EM | Admit: 2021-05-27 | Discharge: 2021-05-28 | Disposition: A | Discharge status: Home / Self Care | Payer: Self-pay | Attending: Family Medicine | Admitting: Family Medicine

## 2021-05-27 DIAGNOSIS — J45909 Unspecified asthma, uncomplicated: Secondary | ICD-10-CM | POA: Insufficient documentation

## 2021-05-27 DIAGNOSIS — T40711A Poisoning by cannabis, accidental (unintentional), initial encounter: Principal | ICD-10-CM | POA: Insufficient documentation

## 2021-05-27 DIAGNOSIS — J9601 Acute respiratory failure with hypoxia: Secondary | ICD-10-CM | POA: Insufficient documentation

## 2021-05-27 DIAGNOSIS — R030 Elevated blood-pressure reading, without diagnosis of hypertension: Secondary | ICD-10-CM | POA: Insufficient documentation

## 2021-05-27 DIAGNOSIS — F1910 Other psychoactive substance abuse, uncomplicated: Secondary | ICD-10-CM

## 2021-05-27 DIAGNOSIS — R9431 Abnormal electrocardiogram [ECG] [EKG]: Secondary | ICD-10-CM

## 2021-05-27 DIAGNOSIS — F17210 Nicotine dependence, cigarettes, uncomplicated: Secondary | ICD-10-CM | POA: Insufficient documentation

## 2021-05-27 DIAGNOSIS — R1110 Vomiting, unspecified: Secondary | ICD-10-CM | POA: Insufficient documentation

## 2021-05-27 DIAGNOSIS — E876 Hypokalemia: Secondary | ICD-10-CM | POA: Insufficient documentation

## 2021-05-27 DIAGNOSIS — F319 Bipolar disorder, unspecified: Secondary | ICD-10-CM

## 2021-05-27 DIAGNOSIS — R778 Other specified abnormalities of plasma proteins: Secondary | ICD-10-CM | POA: Insufficient documentation

## 2021-05-27 DIAGNOSIS — D72829 Elevated white blood cell count, unspecified: Secondary | ICD-10-CM | POA: Insufficient documentation

## 2021-05-27 DIAGNOSIS — T50901A Poisoning by unspecified drugs, medicaments and biological substances, accidental (unintentional), initial encounter: Secondary | ICD-10-CM

## 2021-05-27 HISTORY — DX: Unspecified asthma, uncomplicated: J45.909

## 2021-05-27 HISTORY — DX: Alcohol abuse, uncomplicated: F10.10

## 2021-05-27 HISTORY — DX: Other psychoactive substance abuse, uncomplicated: F19.10

## 2021-05-27 LAB — ECHOCARDIOGRAM COMPLETE
AR max vel: 2.73 cm2
AV Area VTI: 2.59 cm2
AV Area mean vel: 2.63 cm2
AV Mean grad: 5 mmHg
AV Peak grad: 10.6 mmHg
Ao pk vel: 1.63 m/s
Area-P 1/2: 3.08 cm2
Calc EF: 53 %
Height: 76 in
MV VTI: 3 cm2
S' Lateral: 3.2 cm
Single Plane A2C EF: 49.8 %
Single Plane A4C EF: 53.2 %
Weight: 3679.04 oz

## 2021-05-27 LAB — COMPREHENSIVE METABOLIC PANEL
ALT: 36 U/L (ref 0–44)
AST: 67 U/L — ABNORMAL HIGH (ref 15–41)
Albumin: 3.9 g/dL (ref 3.5–5.0)
Alkaline Phosphatase: 45 U/L (ref 38–126)
Anion gap: 12 (ref 5–15)
BUN: 13 mg/dL (ref 6–20)
CO2: 24 mmol/L (ref 22–32)
Calcium: 8.8 mg/dL — ABNORMAL LOW (ref 8.9–10.3)
Chloride: 102 mmol/L (ref 98–111)
Creatinine, Ser: 1.05 mg/dL (ref 0.61–1.24)
GFR, Estimated: >60 mL/min (ref >60)
Glucose, Bld: 122 mg/dL — ABNORMAL HIGH (ref 70–99)
Potassium: 3 mmol/L — ABNORMAL LOW (ref 3.5–5.1)
Sodium: 138 mmol/L (ref 135–145)
Total Bilirubin: 0.7 mg/dL (ref 0.3–1.2)
Total Protein: 7.1 g/dL (ref 6.5–8.1)

## 2021-05-27 LAB — CBC WITH DIFFERENTIAL/PLATELET
Abs Immature Granulocytes: 0.05 10*3/uL (ref 0.00–0.07)
Basophils Absolute: 0.1 10*3/uL (ref 0.0–0.1)
Basophils Relative: 1 %
Eosinophils Absolute: 0.2 10*3/uL (ref 0.0–0.5)
Eosinophils Relative: 2 %
HCT: 36.6 % — ABNORMAL LOW (ref 39.0–52.0)
Hemoglobin: 11.9 g/dL — ABNORMAL LOW (ref 13.0–17.0)
Immature Granulocytes: 1 %
Lymphocytes Relative: 20 %
Lymphs Abs: 1.8 10*3/uL (ref 0.7–4.0)
MCH: 27.5 pg (ref 26.0–34.0)
MCHC: 32.5 g/dL (ref 30.0–36.0)
MCV: 84.7 fL (ref 80.0–100.0)
Monocytes Absolute: 0.5 10*3/uL (ref 0.1–1.0)
Monocytes Relative: 6 %
Neutro Abs: 6.3 10*3/uL (ref 1.7–7.7)
Neutrophils Relative %: 70 %
Platelets: 253 10*3/uL (ref 150–400)
RBC: 4.32 MIL/uL (ref 4.22–5.81)
RDW: 13.7 % (ref 11.5–15.5)
WBC: 9 10*3/uL (ref 4.0–10.5)
nRBC: 0 % (ref 0.0–0.2)

## 2021-05-27 LAB — TROPONIN I (HIGH SENSITIVITY)
Troponin I (High Sensitivity): 21 ng/L — ABNORMAL HIGH (ref <18)
Troponin I (High Sensitivity): 84 ng/L — ABNORMAL HIGH (ref <18)
Troponin I (High Sensitivity): 30 ng/L — ABNORMAL HIGH (ref <18)

## 2021-05-27 LAB — RAPID URINE DRUG SCREEN, HOSP PERFORMED
Amphetamines: NOT DETECTED
Barbiturates: NOT DETECTED
Benzodiazepines: NOT DETECTED
Cocaine: POSITIVE — AB
Opiates: NOT DETECTED
Tetrahydrocannabinol: POSITIVE — AB

## 2021-05-27 LAB — MAGNESIUM: Magnesium: 1.7 mg/dL (ref 1.7–2.4)

## 2021-05-27 LAB — SALICYLATE LEVEL: Salicylate Lvl: <7 mg/dL — ABNORMAL LOW (ref 7.0–30.0)

## 2021-05-27 LAB — ACETAMINOPHEN LEVEL: Acetaminophen (Tylenol), Serum: <10 ug/mL — ABNORMAL LOW (ref 10–30)

## 2021-05-27 LAB — CBG MONITORING, ED: Glucose-Capillary: 128 mg/dL — ABNORMAL HIGH (ref 70–99)

## 2021-05-27 MED ORDER — GABAPENTIN 300 MG PO CAPS
300.0000 mg | ORAL_CAPSULE | Freq: Three times a day (TID) | ORAL | Status: DC
  Administered 2021-05-28 (×2): 300 mg via ORAL
  Filled 2021-05-27 (×2): qty 1

## 2021-05-27 MED ORDER — POTASSIUM CHLORIDE 10 MEQ/100ML IV SOLN
10.0000 meq | INTRAVENOUS | Status: AC
  Administered 2021-05-27 (×2): 10 meq via INTRAVENOUS
  Filled 2021-05-27 (×2): qty 100

## 2021-05-27 MED ORDER — NALOXONE HCL 2 MG/2ML IJ SOSY: 1.0000 mg | PREFILLED_SYRINGE | INTRAMUSCULAR | Status: DC | PRN

## 2021-05-27 MED ORDER — NALOXONE HCL 2 MG/2ML IJ SOSY
2.0000 mg | PREFILLED_SYRINGE | Freq: Once | INTRAMUSCULAR | Status: AC
  Administered 2021-05-27: 2 mg via INTRAVENOUS
  Filled 2021-05-27: qty 2

## 2021-05-27 MED ORDER — TRAZODONE HCL 50 MG PO TABS
150.0000 mg | ORAL_TABLET | Freq: Every day | ORAL | Status: DC
  Administered 2021-05-28: 150 mg via ORAL
  Filled 2021-05-27: qty 3

## 2021-05-27 MED ORDER — SODIUM CHLORIDE 0.9% FLUSH: 3.0000 mL | Freq: Two times a day (BID) | INTRAVENOUS | Status: DC

## 2021-05-27 MED ORDER — ALBUTEROL SULFATE HFA 108 (90 BASE) MCG/ACT IN AERS
2.0000 | INHALATION_SPRAY | Freq: Once | RESPIRATORY_TRACT | Status: AC
  Administered 2021-05-27: 2 via RESPIRATORY_TRACT
  Filled 2021-05-27: qty 6.7

## 2021-05-27 MED ORDER — HYDRALAZINE HCL 20 MG/ML IJ SOLN
10.0000 mg | Freq: Four times a day (QID) | INTRAMUSCULAR | Status: DC | PRN
Start: 2021-05-27 — End: 2021-05-28

## 2021-05-27 MED ORDER — DEXTROSE 5 % IV SOLN
0.5000 mg/h | INTRAVENOUS | Status: DC
  Filled 2021-05-27: qty 10

## 2021-05-27 MED ORDER — ONDANSETRON HCL 4 MG PO TABS: 4.0000 mg | ORAL_TABLET | Freq: Four times a day (QID) | ORAL | Status: DC | PRN

## 2021-05-27 MED ORDER — NALOXONE HCL 2 MG/2ML IJ SOSY
2.0000 mg | PREFILLED_SYRINGE | INTRAMUSCULAR | Status: DC | PRN
  Filled 2021-05-27: qty 2

## 2021-05-27 MED ORDER — SODIUM CHLORIDE 0.9% FLUSH: 3.0000 mL | INTRAVENOUS | Status: DC | PRN

## 2021-05-27 MED ORDER — SODIUM CHLORIDE 0.9 % IV BOLUS
2000.0000 mL | Freq: Once | INTRAVENOUS | Status: AC
  Administered 2021-05-27: 2000 mL via INTRAVENOUS

## 2021-05-27 MED ORDER — POLYETHYLENE GLYCOL 3350 17 G PO PACK: 17.0000 g | PACK | Freq: Every day | ORAL | Status: DC | PRN

## 2021-05-27 MED ORDER — ACETAMINOPHEN 650 MG RE SUPP: 650.0000 mg | Freq: Four times a day (QID) | RECTAL | Status: DC | PRN

## 2021-05-27 MED ORDER — BISACODYL 10 MG RE SUPP: 10.0000 mg | Freq: Every day | RECTAL | Status: DC | PRN

## 2021-05-27 MED ORDER — SODIUM CHLORIDE 0.9 % IV SOLN: INTRAVENOUS | Status: DC

## 2021-05-27 MED ORDER — POTASSIUM CHLORIDE CRYS ER 20 MEQ PO TBCR
40.0000 meq | EXTENDED_RELEASE_TABLET | Freq: Once | ORAL | Status: AC
  Administered 2021-05-27: 40 meq via ORAL
  Filled 2021-05-27: qty 2

## 2021-05-27 MED ORDER — LOPERAMIDE HCL 2 MG PO CAPS
2.0000 mg | ORAL_CAPSULE | Freq: Four times a day (QID) | ORAL | Status: DC | PRN
Start: 2021-05-27 — End: 2021-05-28

## 2021-05-27 MED ORDER — NALOXONE HCL 0.4 MG/ML IJ SOLN
0.4000 mg | Freq: Once | INTRAMUSCULAR | Status: AC
  Administered 2021-05-27: 0.4 mg via INTRAVENOUS
  Filled 2021-05-27: qty 1

## 2021-05-27 MED ORDER — HEPARIN SODIUM (PORCINE) 5000 UNIT/ML IJ SOLN
5000.0000 [IU] | Freq: Three times a day (TID) | INTRAMUSCULAR | Status: DC
  Filled 2021-05-27 (×2): qty 1

## 2021-05-27 MED ORDER — ACETAMINOPHEN 325 MG PO TABS: 650.0000 mg | ORAL_TABLET | Freq: Four times a day (QID) | ORAL | Status: DC | PRN

## 2021-05-27 MED ORDER — SODIUM CHLORIDE 0.9 % IV SOLN: 250.0000 mL | INTRAVENOUS | Status: DC | PRN

## 2021-05-27 MED ORDER — ONDANSETRON HCL 4 MG/2ML IJ SOLN
4.0000 mg | Freq: Four times a day (QID) | INTRAMUSCULAR | Status: DC | PRN
Start: 2021-05-27 — End: 2021-05-28

## 2021-05-27 MED ORDER — PANTOPRAZOLE SODIUM 40 MG PO TBEC
40.0000 mg | DELAYED_RELEASE_TABLET | Freq: Every day | ORAL | Status: DC
Start: 2021-05-27 — End: 2021-05-28
  Administered 2021-05-27 – 2021-05-28 (×2): 40 mg via ORAL
  Filled 2021-05-27 (×2): qty 1

## 2021-05-27 NOTE — Progress Notes (Signed)
*  PRELIMINARY RESULTS* ?Echocardiogram ?2D Echocardiogram has been performed. ? ?Christian Fisher ?05/27/2021, 12:35 PM ?

## 2021-05-27 NOTE — ED Notes (Signed)
Pt noted to be much more alert and oriented now. No longer dosing off in between conversations. Attending MD notified. New orders to discontinue narcan drip.  ?

## 2021-05-27 NOTE — ED Provider Notes (Signed)
8:08 AM Patient seems sleepy but is awake and talking to me.  He feels like he is a little short of breath because his asthma has been exacerbated.  He does not have an albuterol inhaler.  We will give him albuterol 2 puffs now and reevaluate and continue to watch.   ? ?Patient is still hypoxic.  I took him off oxygen and watched him and he progressively went down to 87%.  He reports his shortness of breath is a little bit better.  He reports no narcotic abuse but he only uses cocaine.  He is not sure what happened.  He will need admission due to the hypoxia.  No chest pain.  Of note his troponin went from 21-84 but I suspect this is all related to prolonged hypoxia. ? ?Dr. Mariea Clonts will admit ?  ?Pricilla Loveless, MD ?05/27/21 1026 ? ?

## 2021-05-27 NOTE — ED Notes (Signed)
Pt aware of need for urine sample. Urinal at bedside. Will continue to follow up  ?

## 2021-05-27 NOTE — ED Triage Notes (Signed)
Pt brought in after drug overdose. Per family pt was smoking a blunt prior to going unresponsive. Pt given 4mg  nasal narcan by LOE, another 2mg  IV narcan.  ?

## 2021-05-27 NOTE — ED Notes (Signed)
Pt woke up approx 5 minutes after giving narcan and reports feeling very cold. O2 100 on RA when awake, drops to 91% when sleeping   ?

## 2021-05-27 NOTE — ED Notes (Signed)
Phlebotomist notified nurse and EDP that pt admitted to taking fentanyl while she was drawing his am labs.   ?

## 2021-05-27 NOTE — ED Notes (Signed)
Pt alert and oriented able to carry on conversation, but goes into deep sleep between nurse rounding. Instructed to give 2mg  of narcan as lab tech said he was difficult to arouse ?

## 2021-05-27 NOTE — H&P (Addendum)
?                                                                                           ? ? ? ? Patient Demographics:  ? ? ?Christian Fisher, is a 36 y.o. male  MRN: [id number]   DOB - Sep 15, 1985 ? ?Admit Date - 05/27/2021 ? ?Outpatient Primary MD for the patient is Patient, [name] ? ? Assessment & Plan:  ? ?Assessment and Plan: ?No notes have been filed under this hospital service. ?Service: Hospitalist ? ? ? ? ? ? ? ?1)Polysubstance Abuse with suspected accidental drug overdose--patient denies intentional overdose ?-Patient denies suicidal or homicidal ideation or plan ?-Despite UDS being negative for opiates patient appears to respond to Narcan--- suspect he may have used a synthetic narcotic that does not show up on our UDS ?-UDS did  show cocaine and THC ?-Continue Narcan as needed ?-IV fluids ?-Trazodone and gabapentin as ordered ?-Imodium as needed ? ?2)Bipolar Disorder--history of prior inpatient psychiatric treatment -Including prior treatment at [facility] ?-Currently denies suicidal homicidal ideation or plan ?-Trazodone nightly as ordered ?-Currently cooperative  ?-may consider Haldol if becomes uncooperative ?- ?3) emesis--- episode of emesis without bile or blood ?-IV fluids until oral intake improves ?-As needed antiemetics and PPI ? ? ?4) hypokalemia--- replace potassium, check mag ? ?5) acute hypoxic respiratory failure--hypoxia improving, weaning off oxygen nicely ?-Chest x-ray without acute findings suspect hypoxia likely due to respiratory depression from drug use ? ?6) elevated troponin--- suspect demand ischemia in the setting of hypoxia from drug-induced respiratory depression ?--Initial troponin 21, repeat  84 and repeat 30 ?-EKG sinus rhythm with LVH but no new acute findings otherwise ?-Echo with EF of 55 to 60% without regional wall motion normalities or diastolic  dysfunction or any other significant findings ? ?7) elevated BP--- suspect cocaine induced vasoconstriction ?- may use IV Hydralazine 10 mg  Every 4 hours Prn for systolic blood pressure over 170 mmhg ? ?Disposition/Need for in-Hospital Stay- patient unable to be discharged at this time due to accidental drug overdose requiring repeated doses of Narcan, close observation and IV fluids ?-Anticipate possible discharge home within next 24 hours ?-Consider psychiatry/behavioral health consult if behavioral concerns or acute psychiatric issues arise ? ?Dispo: The patient is from: Home ?             Anticipated d/c is to: Home ?             Anticipated d/c date is: 1 day ?             Patient currently is not medically stable to d/c. ?Barriers: Not Clinically Stable-  ? ? ?With History of - ?Reviewed by me ? ?Past Medical History:  ?Diagnosis Date  ? Asthma   ? Drug abuse ([place])   ? ETOH abuse   ?   ? ?History reviewed. No pertinent surgical history. ? ? ? ?Chief Complaint  ?Patient presents with  ? Drug Overdose  ?  ? ? HPI:  ? ? Christian Fisher  is a 36 y.o. male with past medical history relevant for polysubstance  abuse including cocaine and opiate use in the past, bipolar disorder with previous inpatient psychiatric treatment presents to the ED by EMS after apparently passing out while smoking a blunt ?- per family pt was smoking a blunt prior to going unresponsive. Pt given 67m nasal narcan by LOE, another 237mIV narcan.  ?-Patient denies suicidal or homicidal ideation ?-Initially was quite hypoxic, hypoxia improved after repeated doses of Narcan ?-Chest x-ray without acute findings ?-UDS with cocaine and THC ?-CMP with potassium of 3.0 sodium is 138 creatinine 1.0 AST 67 ALT 36 alk phos 45 T. bili 0.7 ?-Initial troponin 21, repeat 84 and repeat 30 ?-EKG sinus rhythm with LVH but no new acute findings otherwise ?-Per report no fever no chills ?-Patient has 1 episode of emesis in the ED after eating without bile or  blood ?-No chest pains no palpitations no dizziness ?-No significant behavioral concerns at this time ?-Salicylate and acetaminophen levels not elevated ?She 9.0 hemoglobin 11.9 platelets 253 ? ? Review of systems:  ?  ?In addition to the HPI above,  ? ?A full Review of  Systems was done, all other systems reviewed are negative except as noted above in HPI , . ? ? ? Social History:  ?Reviewed by me ? ?  ?Social History  ? ?Tobacco Use  ? Smoking status: Heavy Smoker  ? Smokeless tobacco: Never  ?Substance Use Topics  ? Alcohol use: Not on file  ? ? ? ? ? Family History :  ?Reviewed by me ? ?HTN ? ? Home Medications:  ? ?Prior to Admission medications   ?Not on File  ? ? ? Allergies:  ? ? No Known Allergies ? ? Physical Exam:  ? ?Vitals ? ?Blood pressure (!) 150/114, pulse 68, temperature 97.9 ?F (36.6 ?C), temperature source Oral, resp. rate 15, height 6' 4"  (1.93 m), weight 104.3 kg, SpO2 93 %. ? ?Physical Examination: General appearance -falls asleep easily but wakes up enough to answer questions and have a conversation mental status -cooperative, calm , when he wakes up is alert and oriented x3  ?eyes - sclera anicteric ?Neck - supple, no JVD elevation , ?Nose- [place] 2L/min ?Chest - clear  to auscultation bilaterally, symmetrical air movement,  ?Heart - S1 and S2 normal, regular  ?Abdomen - soft, nontender, nondistended, no masses or organomegaly ?Neurological - screening mental status exam normal, neck supple without rigidity, cranial nerves II through XII intact, DTR's normal and symmetric ?Extremities - no pedal edema noted, intact peripheral pulses  ?Skin - warm, dry ? ? Data Review:  ? ? CBC ?Recent Labs  ?Lab 05/27/21 ?0616  ?WBC 9.0  ?HGB 11.9*  ?HCT 36.6*  ?PLT 253  ?MCV 84.7  ?MCH 27.5  ?MCHC 32.5  ?RDW 13.7  ?LYMPHSABS 1.8  ?MONOABS 0.5  ?EOSABS 0.2  ?BASOSABS 0.1  ? ?------------------------------------------------------------------------------------------------------------------ ? ?Chemistries  ?Recent  Labs  ?Lab 05/27/21 ?0616  ?NA 138  ?K 3.0*  ?CL 102  ?CO2 24  ?GLUCOSE 122*  ?BUN 13  ?CREATININE 1.05  ?CALCIUM 8.8*  ?AST 67*  ?ALT 36  ?ALKPHOS 45  ?BILITOT 0.7  ? ?------------------------------------------------------------------------------------------------------------------ ?estimated creatinine clearance is 129 mL/min (by C-G formula based on SCr of 1.05 mg/dL). ?------------------------------------------------------------------------------------------------------------------ ?No results for input(s): TSH, T4TOTAL, T3FREE, THYROIDAB in the last 72 hours. ? ?Invalid input(s): FREET3 ? ? ?Coagulation profile ?No results for input(s): INR, PROTIME in the last 168 hours. ?------------------------------------------------------------------------------------------------------------------- ?No results for input(s): DDIMER in the last 72 hours. ?------------------------------------------------------------------------------------------------------------------- ? ?Cardiac Enzymes ?No  results for input(s): CKMB, TROPONINI, MYOGLOBIN in the last 168 hours. ? ?Invalid input(s): CK ?------------------------------------------------------------------------------------------------------------------ ?No results found for: BNP ? ? ?--------------------------------------------------------------------------------------------------------------- ? ?Urinalysis ?No results found for: COLORURINE, APPEARANCEUR, [place], [place], [place], [place], [place], [place], PROTEINUR, UROBILINOGEN, NITRITE, LEUKOCYTESUR ? ?---------------------------------------------------------------------------------------------------------------- ? ? Imaging Results:  ? ? DG Chest Portable 1 View ? ?Result Date: 05/27/2021 ?CLINICAL DATA:  Drug overdose. EXAM: PORTABLE CHEST 1 VIEW COMPARISON:  09/14/2008 FINDINGS: The lungs are clear without focal pneumonia, edema, pneumothorax or pleural effusion. Cardiopericardial silhouette is at upper limits of  normal for size. The visualized bony structures of the thorax are unremarkable. Telemetry leads overlie the chest. IMPRESSION: No active disease. Electronically Signed   By: Misty Stanley M.D.   On: 05/27/2021 06:07

## 2021-05-27 NOTE — ED Provider Notes (Signed)
?[facility] EMERGENCY DEPARTMENT ?Provider Note ? ?CSN: [id number] ?Arrival date & time: 05/27/21 0539 ? ?Chief Complaint(s) ?Drug Overdose ? ?HPI ?Christian Fisher is a 36 y.o. male with PMH bipolar 1, psychosis, polysubstance use who presents emergency department for evaluation of drug overdose.  Patient states that he remembers smoking a blunt and snorting something but is unsure of what happened next.  EMS was called and found the patient to be diaphoretic and gave the patient 4 mg of intranasal Narcan followed by 2 mg of IV Narcan.  Patient was bagged in the field, but arrives to the ER alert and oriented answering all questions appropriately.  He is endorsing mild shortness of breath but denies chest pain, abdominal pain, nausea, vomiting or other systemic symptoms. ? ?Drug Overdose ?Associated symptoms include shortness of breath.  ? ?Past Medical History ?Past Medical History:  ?Diagnosis Date  ? Drug abuse (HCC)   ? ETOH abuse   ? ?Patient Active Problem List  ? Diagnosis Date Noted  ? Bipolar 1 disorder (HCC)   ? Aggression 02/17/2019  ? Psychosis (HCC) 02/17/2019  ? ?Home Medication(s) ?Prior to Admission medications   ?Not on File  ?                                                                                                                                  ?Past Surgical History ?History reviewed. No pertinent surgical history. ?Family History ?No family history on file. ? ?Social History ?Social History  ? ?Tobacco Use  ? Smoking status: Heavy Smoker  ? Smokeless tobacco: Never  ? ?Allergies ?Patient has no known allergies. ? ?Review of Systems ?Review of Systems  ?Respiratory:  Positive for shortness of breath.   ? ?Physical Exam ?Vital Signs  ?I have reviewed the triage vital signs ?BP (!) 149/103 (BP Location: Right Arm)   Pulse 78   Temp 97.9 ?F (36.6 ?C) (Oral)   Resp 14   Ht 6\' 4"  (1.93 m)   Wt 104.3 kg   SpO2 94%   BMI 27.99 kg/m?  ? ?Physical Exam ?Vitals and nursing note reviewed.   ?Constitutional:   ?   General: He is not in acute distress. ?   Appearance: He is well-developed.  ?HENT:  ?   Head: Normocephalic and atraumatic.  ?Eyes:  ?   Conjunctiva/sclera: Conjunctivae normal.  ?Cardiovascular:  ?   Rate and Rhythm: Normal rate and regular rhythm.  ?   Heart sounds: No murmur heard. ?Pulmonary:  ?   Effort: Pulmonary effort is normal. No respiratory distress.  ?   Breath sounds: Normal breath sounds.  ?Abdominal:  ?   Palpations: Abdomen is soft.  ?   Tenderness: There is no abdominal tenderness.  ?Musculoskeletal:     ?   General: No swelling.  ?   Cervical back: Neck supple.  ?Skin: ?   General: Skin is warm and dry.  ?  Capillary Refill: Capillary refill takes less than 2 seconds.  ?Neurological:  ?   Mental Status: He is alert.  ?Psychiatric:     ?   Mood and Affect: Mood normal.  ? ? ?ED Results and Treatments ?Labs ?(all labs ordered are listed, but only abnormal results are displayed) ?Labs Reviewed  ?CBG MONITORING, ED  ?                                                                                                                       ? ?Radiology ?No results found. ? ?Pertinent labs & imaging results that were available during my care of the patient were reviewed by me and considered in my medical decision making (see MDM for details). ? ?Medications Ordered in ED ?Medications  ?naloxone (NARCAN) injection 0.4 mg (has no administration in time range)  ?                                                               ?                                                                    ?Procedures ?Marland KitchenCritical Care ?Performed by: Glendora Score, MD ?Authorized by: Glendora Score, MD  ? ?Critical care provider statement:  ?  Critical care time (minutes):  30 ?  Critical care was necessary to treat or prevent imminent or life-threatening deterioration of the following conditions:  Respiratory failure and toxidrome ?  Critical care was time spent personally by me on the following  activities:  Development of treatment plan with patient or surrogate, discussions with consultants, evaluation of patient's response to treatment, examination of patient, ordering and review of laboratory studies, ordering and review of radiographic studies, ordering and performing treatments and interventions, pulse oximetry, re-evaluation of patient's condition and review of old charts ? ?(including critical care time) ? ?Medical Decision Making / ED Course ? ? ?This patient presents to the ED for concern of drug overdose, this involves an extensive number of treatment options, and is a complaint that carries with it a high risk of complications and morbidity.  The differential diagnosis includes fentanyl overdose, heroin overdose, unspecified opioid overdose, polysubstance use, aspiration ? ?MDM: ?Patient seen emergency department for evaluation of drug overdose.  Physical exam is unremarkable outside of a mildly tachypneic patient.  Laboratory evaluation with initial blood glucose of 128.  ECG nonischemic.  Chest x-ray unremarkable.  Aspirin Tylenol negative.  Potassium 3.0, hemoglobin 11.9, high-sensitivity troponin mildly elevated at 21 likely demand ischemia due  to small episode of hypoxia today.  Patient started to become more somnolent and dropped his oxygen saturations on room air and thus was placed on 2 L nasal cannula and given a total of 0.4 Narcan followed by an additional 2 mg IV dose.  Patient had improvement of his mental status after the 2 mg dose.  Thus, current plan is to observe the patient in the emergency department for at least 2 hours and wean him off the oxygen prior to discharge.  Plan to start Narcan drip if patient continually somnolent.  Patient then signed out to oncoming provider.  Please see provider signout for continuation of work-up. ? ? ?Additional history obtained: ?-Additional history obtained from EMS ?-External records from outside source obtained and reviewed including:  Chart review including previous notes, labs, imaging, consultation notes ? ? ?Lab Tests: ?-I ordered, reviewed, and interpreted labs.   ?The pertinent results include:   ?Labs Reviewed  ?CBG MONITORING, ED  ?  ? ? ?EKG  ? EKG Interpretation ? ?Date/Time:  Thursday May 27 2021 05:45:49 EDT ?Ventricular Rate:  79 ?PR Interval:  147 ?QRS Duration: 89 ?QT Interval:  430 ?QTC Calculation: 493 ?R Axis:   55 ?Text Interpretation: Sinus rhythm Left ventricular hypertrophy Borderline prolonged QT interval Confirmed by Teriann Livingood ([id number]) on 05/27/2021 5:51:22 AM ?  ? ?  ? ? ? ?Imaging Studies ordered: ?I ordered imaging studies including CXR ?I independently visualized and interpreted imaging. ?I agree with the radiologist interpretation ? ? ?Medicines ordered and prescription drug management: ?Meds ordered this encounter  ?Medications  ? naloxone (NARCAN) injection 0.4 mg  ?  ?-I have reviewed the patients home medicines and have made adjustments as needed ? ?Critical interventions ?Narcan administration, oxygen supplementation ? ?Cardiac Monitoring: ?The patient was maintained on a cardiac monitor.  I personally viewed and interpreted the cardiac monitored which showed an underlying rhythm of: NSR ? ?Social Determinants of Health:  ?Factors impacting patients care include: Polysubstance use ? ? ?Reevaluation: ?After the interventions noted above, I reevaluated the patient and found that they have :improved ? ?Co morbidities that complicate the patient evaluation ? ?Past Medical History:  ?Diagnosis Date  ? Drug abuse (HCC)   ? ETOH abuse   ?  ? ? ?Dispostion: ?I considered admission for this patient, and disposition will be pending successful weaning off oxygen and metabolization of underlying drugs.  Please see provider signout note for continuation of work-up. ? ? ? ? ?Final Clinical Impression(s) / ED Diagnoses ?Final diagnoses:  ?None  ? ? ? ?@PCDICTATION @ ? ?  ? , MD ?05/27/21 [phone] ? ?

## 2021-05-28 ENCOUNTER — Other Ambulatory Visit: Payer: Self-pay

## 2021-05-28 LAB — CBC
HCT: 36 % — ABNORMAL LOW (ref 39.0–52.0)
Hemoglobin: 11.3 g/dL — ABNORMAL LOW (ref 13.0–17.0)
MCH: 27.1 pg (ref 26.0–34.0)
MCHC: 31.4 g/dL (ref 30.0–36.0)
MCV: 86.3 fL (ref 80.0–100.0)
Platelets: 226 10*3/uL (ref 150–400)
RBC: 4.17 MIL/uL — ABNORMAL LOW (ref 4.22–5.81)
RDW: 13.7 % (ref 11.5–15.5)
WBC: 14.1 10*3/uL — ABNORMAL HIGH (ref 4.0–10.5)
nRBC: 0 % (ref 0.0–0.2)

## 2021-05-28 LAB — BASIC METABOLIC PANEL
Anion gap: 5 (ref 5–15)
BUN: 11 mg/dL (ref 6–20)
CO2: 28 mmol/L (ref 22–32)
Calcium: 8.5 mg/dL — ABNORMAL LOW (ref 8.9–10.3)
Chloride: 105 mmol/L (ref 98–111)
Creatinine, Ser: 0.96 mg/dL (ref 0.61–1.24)
GFR, Estimated: >60 mL/min (ref >60)
Glucose, Bld: 111 mg/dL — ABNORMAL HIGH (ref 70–99)
Potassium: 3.7 mmol/L (ref 3.5–5.1)
Sodium: 138 mmol/L (ref 135–145)

## 2021-05-28 LAB — HIV ANTIBODY (ROUTINE TESTING W REFLEX): HIV Screen 4th Generation wRfx: NONREACTIVE

## 2021-05-28 MED ORDER — POTASSIUM CHLORIDE 10 MEQ/100ML IV SOLN
INTRAVENOUS | Status: AC
  Filled 2021-05-28: qty 100

## 2021-05-28 NOTE — Plan of Care (Signed)

## 2021-05-28 NOTE — Progress Notes (Signed)
Nsg Discharge Note ? ?Admit Date:  05/27/2021 ?Discharge date: 05/28/2021 ?  ?Christian Fisher to be D/C'd Home per MD order.  AVS completed. ?Patient/caregiver able to verbalize understanding. ? ?Discharge Medication: ?Allergies as of 05/28/2021   ?No Known Allergies ?  ? ?  ?Medication List  ?  ?You have not been prescribed any medications. ?  ? ? ?Discharge Assessment: ?Vitals:  ? 05/28/21 0200 05/28/21 0555  ?BP: (!) 150/90 120/73  ?Pulse: 61 81  ?Resp: 18 18  ?Temp: 98.7 ?F (37.1 ?C) 98.5 ?F (36.9 ?C)  ?SpO2: 100% 100%  ? Skin clean, dry and intact without evidence of skin break down, no evidence of skin tears noted. ?IV catheter discontinued intact. Site without signs and symptoms of complications - no redness or edema noted at insertion site, patient denies c/o pain - only slight tenderness at site.  Dressing with slight pressure applied. ? ?D/c Instructions-Education: ?Discharge instructions given to patient/family with verbalized understanding. ?D/c education completed with patient/family including follow up instructions, medication list, d/c activities limitations if indicated, with other d/c instructions as indicated by MD - patient able to verbalize understanding, all questions fully answered. ?Patient instructed to return to ED, call 911, or call MD for any changes in condition.  ?Patient instructed to call his ride and alert [place] when they are downstairs.  ? ?Alfonse Alpers, RN ?05/28/2021 10:48 AM   ?

## 2021-05-28 NOTE — Progress Notes (Signed)
Patient is waiting on D/C med list. Provided patient with extra breakfast tray. ?

## 2021-05-28 NOTE — Progress Notes (Signed)
Patient refused last 2 bags of IV K+. Patient stated it was burning his arm too bad and he did not want a bad bruise. Cool compress given to patient to help with pain. Patient is resting well. Secure chat was sent to Dr. Thomes Dinning to make him aware.  ?

## 2021-05-28 NOTE — Discharge Summary (Signed)
?                                                                                ? ? ?Christian Fisher, is a 36 y.o. male  DOB 1985/10/18  MRN [id number]. ? ?Admission date:  05/27/2021  Admitting Physician  Chisom Aust Denton Brick, MD ? ?Discharge Date:  05/28/2021  ? ?Primary MD  Patient, [name] ? ?Recommendations for primary care physician for things to follow:  ?- ?1)Complete abstinence from drugs including cocaine and narcotics strongly advised--- outpatient or inpatient drug rehab program strongly recommended  ? ?Admission Diagnosis  OD (overdose of drug) [T50.901A] ? ? ?Discharge Diagnosis  OD (overdose of drug) [T50.901A]   ? ?Principal Problem: ?  OD (overdose of drug) ?Active Problems: ?  Bipolar 1 disorder ([place]) ?  Polysubstance abuse ([place]) ?    ? ?Past Medical History:  ?Diagnosis Date  ? Asthma   ? Drug abuse ([place])   ? ETOH abuse   ? ? ?History reviewed. No pertinent surgical history. ? ? ? ? HPI  from the history and physical done on the day of admission:  ? ?  ?Christian Fisher  is a 36 y.o. male with past medical history relevant for polysubstance abuse including cocaine and opiate use in the past, bipolar disorder with previous inpatient psychiatric treatment presents to the ED by EMS after apparently passing out while smoking a blunt ?- per family pt was smoking a blunt prior to going unresponsive. Pt given 5m nasal narcan by [facility], another 277mIV narcan.  ?-Patient denies suicidal or homicidal ideation ?-Initially was quite hypoxic, hypoxia improved after repeated doses of Narcan ?-Chest x-ray without acute findings ?-UDS with cocaine and THC ?-CMP with potassium of 3.0 sodium is 138 creatinine 1.0 AST 67 ALT 36 alk phos 45 T. bili 0.7 ?-Initial troponin 21, repeat 84 and repeat 30 ?-EKG sinus rhythm with LVH but no new acute findings otherwise ?-Per report no fever no chills ?-Patient has 1 episode of emesis in the ED after eating without bile or blood ?-No chest pains no palpitations no  dizziness ?-No significant behavioral concerns at this time ?-Salicylate and acetaminophen levels not elevated ?She 9.0 hemoglobin 11.9 platelets 253 ? ? ? ? Hospital Course:  ? ?Assessment and Plan: ?1)Polysubstance Abuse with suspected accidental drug overdose-- ?--patient denies intentional overdose ?-Patient denies suicidal or homicidal ideation or plan ?-Despite UDS being negative for opiates patient appears to respond to Narcan--- suspect he may have used a synthetic narcotic that does not show up on our UDS ?-UDS did  show cocaine and THC ?-Mentation is back to baseline ?-Has not required further Narcan ?-Hypoxia has resolved completely ?-Post ambulation O2 sats 98 to 100% on room ?  ?2)Bipolar Disorder--history of prior inpatient psychiatric [facility] -[facility] ?-Currently denies suicidal homicidal ideation or plan ?-Patient remains cooperative without any evidence of psychosis ?-Outpatient follow-up with PCP/psychiatrist advised ?- ?3) emesis--- episode of emesis without bile or blood ?-Hgb stable after IV fluids ?-Eating and tolerating oral intake well, no further emesis abdominal pain ?  ? 4) hypokalemia--- replaced potassium ?  ?5) acute hypoxic respiratory  failure-- ?--Chest x-ray without acute findings suspect hypoxia likely due to respiratory depression from drug use ?--Hypoxia has resolved completely ?-Post ambulation O2 sats 98 to 100% on room ?  ?6) elevated troponin--- suspect demand ischemia in the setting of hypoxia from drug-induced respiratory depression ?--Initial troponin 21, repeat  84 and repeat 30 ?-EKG sinus rhythm with LVH but no new acute findings otherwise ?-Echo with EF of 55 to 60% without regional wall motion normalities or diastolic dysfunction or any other significant findings ?-No chest pains or ACS type symptoms ?-Dyspnea at rest or dyspnea on exertion no dizziness no palpitations ?  ?7) elevated BP--- suspect cocaine  induced vasoconstriction ?- may use IV Hydralazine 10 mg  Every 4 hours Prn for systolic blood pressure over 170 mmhg ?-BP has normalized ? ?8) leukocytosis--- suspect this is reactive, no evidence of infection at this time ? ? ?Disposition----Home ?  ?Dispo: The patient is from: Home ?             Anticipated d/c is to: Home ?              ?Discharge Condition: Stable ? ?Follow UP--PCP/psychiatry ? ?Diet and Activity recommendation:  As advised ? ?Discharge Instructions   ? ?Discharge Instructions   ? ? Call MD for:  difficulty breathing, headache or visual disturbances   Complete by: As directed ?  ? Call MD for:  persistant dizziness or light-headedness   Complete by: As directed ?  ? Call MD for:  persistant nausea and vomiting   Complete by: As directed ?  ? Call MD for:  temperature >100.4   Complete by: As directed ?  ? Diet general   Complete by: As directed ?  ? Discharge instructions   Complete by: As directed ?  ? 1)Complete abstinence from drugs including cocaine and narcotics strongly advised--- outpatient or inpatient drug rehab program strongly recommended  ? Increase activity slowly   Complete by: As directed ?  ? ?  ? ? ? ? Discharge Medications  ? ?  ?Allergies as of 05/28/2021   ?No Known Allergies ?  ? ?  ?Medication List  ?  ?You have not been prescribed any medications. ?  ? ? ?Major procedures and Radiology Reports - PLEASE review detailed and final reports for all details, in brief -  ? ?DG Chest Portable 1 View ? ?Result Date: 05/27/2021 ?CLINICAL DATA:  Drug overdose. EXAM: PORTABLE CHEST 1 VIEW COMPARISON:  09/14/2008 FINDINGS: The lungs are clear without focal pneumonia, edema, pneumothorax or pleural effusion. Cardiopericardial silhouette is at upper limits of normal for size. The visualized bony structures of the thorax are unremarkable. Telemetry leads overlie the chest. IMPRESSION: No active disease. Electronically Signed   By: Misty Stanley M.D.   On: 05/27/2021 06:07  ? ?ECHOCARDIOGRAM  COMPLETE ? ?Result Date: 05/27/2021 ?   ECHOCARDIOGRAM REPORT   Patient Name:   Christian Fisher Date of Exam: 05/27/2021 Medical Rec #:  [id number]           Height:       76.0 in Accession #:    [id number]          Weight:       229.9 lb Date of Birth:  23-Nov-1985           BSA:          2.351 m? Patient Age:    35 years  BP:           134/89 mmHg Patient Gender: M                   HR:           69 bpm. Exam Location:  [facility] Procedure: 2D Echo, Cardiac Doppler and Color Doppler Indications:    Abnormal ECG  History:        Patient has no prior history of Echocardiogram examinations.                 Bipolar, OD.  Sonographer:    Wenda Low Referring Phys: [place]  1. Left ventricular ejection fraction, by estimation, is 55 to 60%. The left ventricle has normal function. The left ventricle has no regional wall motion abnormalities. There is mild concentric left ventricular hypertrophy. Left ventricular diastolic parameters were normal.  2. Right ventricular systolic function is normal. The right ventricular size is normal. There is normal pulmonary artery systolic pressure.  3. Left atrial size was mildly dilated.  4. Right atrial size was mildly dilated.  5. The mitral valve is normal in structure. Trivial mitral valve regurgitation.  6. The aortic valve is tricuspid. Aortic valve regurgitation is not visualized. No aortic stenosis is present.  7. Aortic dilatation noted. There is borderline dilatation of the aortic root, measuring 37 mm.  8. The inferior vena cava is normal in size with greater than 50% respiratory variability, suggesting right atrial pressure of 3 mmHg. Comparison(s): No prior Echocardiogram. FINDINGS  Left Ventricle: Left ventricular ejection fraction, by estimation, is 55 to 60%. The left ventricle has normal function. The left ventricle has no regional wall motion abnormalities. The left ventricular internal cavity size was normal in size. There is   mild concentric left ventricular hypertrophy. Left ventricular diastolic parameters were normal. Right Ventricle: The right ventricular size is normal. No increase in right ventricular wall thickness. Right

## 2021-05-28 NOTE — Discharge Instructions (Signed)
1)Complete abstinence from drugs including cocaine and narcotics strongly advised--- outpatient or inpatient drug rehab program strongly recommended ?

## 2021-05-28 NOTE — TOC Transition Note (Signed)
Transition of Care (TOC) - CM/SW Discharge Note ? ? ?Patient Details  ?Name: Christian Fisher ?MRN: [id number] ?Date of Birth: 07-16-1985 ? ?Transition of Care (TOC) CM/SW Contact:  ?Villa Herb, LCSWA ?Phone Number: ?05/28/2021, 9:41 AM ? ? ?Clinical Narrative:    ?TOC consulted for substance use resources. CSW spoke with pt about interest in resources. Pt states that he would take the resources. CSW to provide resources to pt in room. TOC signing off.  ? ?Final next level of care: Home/Self Care ?Barriers to Discharge: No Barriers Identified ? ? ?Patient Goals and CMS Choice ?Patient states their goals for this hospitalization and ongoing recovery are:: Return home ?  ?  ? ?Discharge Placement ?  ?           ?  ?  ?  ?  ? ?Discharge Plan and Services ?  ?  ?           ?  ?  ?  ?  ?  ?  ?  ?  ?  ?  ? ?Social Determinants of Health (SDOH) Interventions ?  ? ? ?Readmission Risk Interventions ?   ? View : No data to display.  ?  ?  ?  ? ? ? ? ? ?

## 2021-10-14 ENCOUNTER — Encounter (HOSPITAL_COMMUNITY): Payer: Self-pay | Admitting: Emergency Medicine

## 2021-10-14 ENCOUNTER — Other Ambulatory Visit: Payer: Self-pay

## 2021-10-14 ENCOUNTER — Emergency Department (HOSPITAL_COMMUNITY)
Admission: EM | Admit: 2021-10-14 | Discharge: 2021-10-14 | Discharge status: Against Medical Advice | Payer: Self-pay | Attending: Emergency Medicine | Admitting: Emergency Medicine

## 2021-10-14 DIAGNOSIS — W57XXXA Bitten or stung by nonvenomous insect and other nonvenomous arthropods, initial encounter: Secondary | ICD-10-CM | POA: Insufficient documentation

## 2021-10-14 DIAGNOSIS — S40862A Insect bite (nonvenomous) of left upper arm, initial encounter: Secondary | ICD-10-CM | POA: Insufficient documentation

## 2021-10-14 DIAGNOSIS — Z5321 Procedure and treatment not carried out due to patient leaving prior to being seen by health care provider: Secondary | ICD-10-CM | POA: Insufficient documentation

## 2021-10-14 DIAGNOSIS — R0789 Other chest pain: Secondary | ICD-10-CM | POA: Insufficient documentation

## 2021-10-14 NOTE — ED Triage Notes (Signed)
Pt presents with insect bite, catepillar, c/o of left arm pain and chest tightness, EKG performed in triage.

## 2021-10-20 DIAGNOSIS — R591 Generalized enlarged lymph nodes: Secondary | ICD-10-CM | POA: Diagnosis not present

## 2021-10-20 DIAGNOSIS — Z720 Tobacco use: Secondary | ICD-10-CM | POA: Diagnosis not present

## 2021-10-20 DIAGNOSIS — R059 Cough, unspecified: Secondary | ICD-10-CM | POA: Diagnosis not present

## 2021-10-20 DIAGNOSIS — R911 Solitary pulmonary nodule: Secondary | ICD-10-CM | POA: Diagnosis not present

## 2021-10-20 DIAGNOSIS — Z20822 Contact with and (suspected) exposure to covid-19: Secondary | ICD-10-CM | POA: Diagnosis not present

## 2021-10-20 DIAGNOSIS — J851 Abscess of lung with pneumonia: Secondary | ICD-10-CM | POA: Diagnosis not present

## 2021-10-20 DIAGNOSIS — J984 Other disorders of lung: Secondary | ICD-10-CM | POA: Diagnosis not present

## 2021-10-21 DIAGNOSIS — J851 Abscess of lung with pneumonia: Secondary | ICD-10-CM | POA: Diagnosis not present

## 2021-10-21 DIAGNOSIS — J189 Pneumonia, unspecified organism: Secondary | ICD-10-CM | POA: Diagnosis not present

## 2021-10-22 DIAGNOSIS — J851 Abscess of lung with pneumonia: Secondary | ICD-10-CM | POA: Diagnosis not present

## 2021-10-22 DIAGNOSIS — R69 Illness, unspecified: Secondary | ICD-10-CM | POA: Diagnosis not present

## 2021-10-22 DIAGNOSIS — J45909 Unspecified asthma, uncomplicated: Secondary | ICD-10-CM | POA: Diagnosis not present

## 2021-10-22 DIAGNOSIS — Z6827 Body mass index (BMI) 27.0-27.9, adult: Secondary | ICD-10-CM | POA: Diagnosis not present

## 2021-10-22 DIAGNOSIS — E663 Overweight: Secondary | ICD-10-CM | POA: Diagnosis not present

## 2021-10-22 DIAGNOSIS — I10 Essential (primary) hypertension: Secondary | ICD-10-CM | POA: Diagnosis not present

## 2021-10-22 DIAGNOSIS — J189 Pneumonia, unspecified organism: Secondary | ICD-10-CM | POA: Diagnosis not present

## 2021-10-22 DIAGNOSIS — F1490 Cocaine use, unspecified, uncomplicated: Secondary | ICD-10-CM | POA: Diagnosis not present

## 2021-10-23 DIAGNOSIS — J189 Pneumonia, unspecified organism: Secondary | ICD-10-CM | POA: Diagnosis not present

## 2021-12-10 ENCOUNTER — Emergency Department (HOSPITAL_COMMUNITY): Admission: EM | Admit: 2021-12-10 | Discharge: 2021-12-10 | Discharge status: Against Medical Advice | Payer: Self-pay

## 2022-04-12 ENCOUNTER — Emergency Department (HOSPITAL_COMMUNITY)
Admission: EM | Admit: 2022-04-12 | Discharge: 2022-04-13 | Disposition: A | Discharge status: Home / Self Care | Attending: Emergency Medicine | Admitting: Emergency Medicine

## 2022-04-12 ENCOUNTER — Other Ambulatory Visit: Payer: Self-pay

## 2022-04-12 ENCOUNTER — Encounter (HOSPITAL_COMMUNITY): Payer: Self-pay | Admitting: Emergency Medicine

## 2022-04-12 ENCOUNTER — Emergency Department (HOSPITAL_COMMUNITY)

## 2022-04-12 DIAGNOSIS — R109 Unspecified abdominal pain: Secondary | ICD-10-CM | POA: Insufficient documentation

## 2022-04-12 DIAGNOSIS — J45909 Unspecified asthma, uncomplicated: Secondary | ICD-10-CM | POA: Diagnosis not present

## 2022-04-12 LAB — CBC
HCT: 38.9 % — ABNORMAL LOW (ref 39.0–52.0)
Hemoglobin: 12.8 g/dL — ABNORMAL LOW (ref 13.0–17.0)
MCH: 27.5 pg (ref 26.0–34.0)
MCHC: 32.9 g/dL (ref 30.0–36.0)
MCV: 83.7 fL (ref 80.0–100.0)
Platelets: 240 10*3/uL (ref 150–400)
RBC: 4.65 MIL/uL (ref 4.22–5.81)
RDW: 12.4 % (ref 11.5–15.5)
WBC: 9.9 10*3/uL (ref 4.0–10.5)
nRBC: 0 % (ref 0.0–0.2)

## 2022-04-12 LAB — URINALYSIS, ROUTINE W REFLEX MICROSCOPIC
Bilirubin Urine: NEGATIVE
Glucose, UA: NEGATIVE mg/dL
Hgb urine dipstick: NEGATIVE
Ketones, ur: NEGATIVE mg/dL
Leukocytes,Ua: NEGATIVE
Nitrite: NEGATIVE
Protein, ur: NEGATIVE mg/dL
Specific Gravity, Urine: 1.01 (ref 1.005–1.030)
pH: 7 (ref 5.0–8.0)

## 2022-04-12 LAB — COMPREHENSIVE METABOLIC PANEL
ALT: 20 U/L (ref 0–44)
AST: 20 U/L (ref 15–41)
Albumin: 4.4 g/dL (ref 3.5–5.0)
Alkaline Phosphatase: 37 U/L — ABNORMAL LOW (ref 38–126)
Anion gap: 8 (ref 5–15)
BUN: 15 mg/dL (ref 6–20)
CO2: 26 mmol/L (ref 22–32)
Calcium: 9.2 mg/dL (ref 8.9–10.3)
Chloride: 103 mmol/L (ref 98–111)
Creatinine, Ser: 0.91 mg/dL (ref 0.61–1.24)
GFR, Estimated: >60 mL/min (ref >60)
Glucose, Bld: 89 mg/dL (ref 70–99)
Potassium: 3.3 mmol/L — ABNORMAL LOW (ref 3.5–5.1)
Sodium: 137 mmol/L (ref 135–145)
Total Bilirubin: 0.3 mg/dL (ref 0.3–1.2)
Total Protein: 7.7 g/dL (ref 6.5–8.1)

## 2022-04-12 LAB — LIPASE, BLOOD: Lipase: 42 U/L (ref 11–51)

## 2022-04-12 MED ORDER — IOHEXOL 300 MG/ML  SOLN
100.0000 mL | Freq: Once | INTRAMUSCULAR | Status: AC | PRN
  Administered 2022-04-12: 100 mL via INTRAVENOUS

## 2022-04-12 MED ORDER — METHOCARBAMOL 500 MG PO TABS
1000.0000 mg | ORAL_TABLET | Freq: Once | ORAL | Status: AC
  Administered 2022-04-12: 1000 mg via ORAL
  Filled 2022-04-12: qty 2

## 2022-04-12 NOTE — ED Triage Notes (Signed)
Pt c/o right flank pain all day.

## 2022-04-12 NOTE — ED Provider Notes (Signed)
  [facility] Provider Note   CSN: [id number] Arrival date & time: 04/12/22  1927     History {Add pertinent medical, surgical, social history, OB history to HPI:1} Chief Complaint  Patient presents with   Flank Pain    Christian Fisher is a 37 y.o. male.   Flank Pain       Home Medications Prior to Admission medications   Not on File      Allergies    Bee venom and Shellfish allergy    Review of Systems   Review of Systems  Genitourinary:  Positive for flank pain.    Physical Exam Updated Vital Signs BP (!) 153/102   Pulse (!) 57   Temp 98.1 F (36.7 C) (Oral)   Resp 20   Ht 5' 11"$  (1.803 m)   Wt 90.7 kg   SpO2 100%   BMI 27.89 kg/m  Physical Exam  ED Results / Procedures / Treatments   Labs (all labs ordered are listed, but only abnormal results are displayed) Labs Reviewed  URINALYSIS, ROUTINE W REFLEX MICROSCOPIC - Abnormal; Notable for the following components:      Result Value   Bacteria, UA RARE (*)    All other components within normal limits  LIPASE, BLOOD  COMPREHENSIVE METABOLIC PANEL  CBC    EKG None  Radiology No results found.  Procedures Procedures  {Document cardiac monitor, telemetry assessment procedure when appropriate:1}  Medications Ordered in ED Medications - No data to display  ED Course/ Medical Decision Making/ A&P   {   Click here for ABCD2, HEART and other calculatorsREFRESH Note before signing :1}                          Medical Decision Making  ***  {Document critical care time when appropriate:1} {Document review of labs and clinical decision tools ie heart score, Chads2Vasc2 etc:1}  {Document your independent review of radiology images, and any outside records:1} {Document your discussion with family members, caretakers, and with consultants:1} {Document social determinants of health affecting pt's care:1} {Document your decision making why or why  not admission, treatments were needed:1} Final Clinical Impression(s) / ED Diagnoses Final diagnoses:  None    Rx / DC Orders ED Discharge Orders     None

## 2022-04-13 MED ORDER — POLYETHYLENE GLYCOL 3350 17 G PO PACK: 17.0000 g | PACK | Freq: Every day | ORAL | 0 refills | Status: AC

## 2022-04-13 MED ORDER — POTASSIUM CHLORIDE CRYS ER 20 MEQ PO TBCR
40.0000 meq | EXTENDED_RELEASE_TABLET | Freq: Once | ORAL | Status: AC
  Administered 2022-04-13: 40 meq via ORAL
  Filled 2022-04-13: qty 2

## 2022-04-13 NOTE — Discharge Instructions (Addendum)
Your CT scan showed constipation.  Attached is a prescription for a medication called MiraLAX.  This will help soften your stools.  Take as prescribed.  Ensure that you stay hydrated.  Return to the emergency department for any new or worsening symptoms of concern.

## 2023-08-01 IMAGING — DX DG CHEST 1V PORT
1 series · 1 of 1 positions shown · non-contrast
Comparison: 09/14/2008

CLINICAL DATA: Drug overdose.

EXAM:
PORTABLE CHEST 1 VIEW

[chest ap]
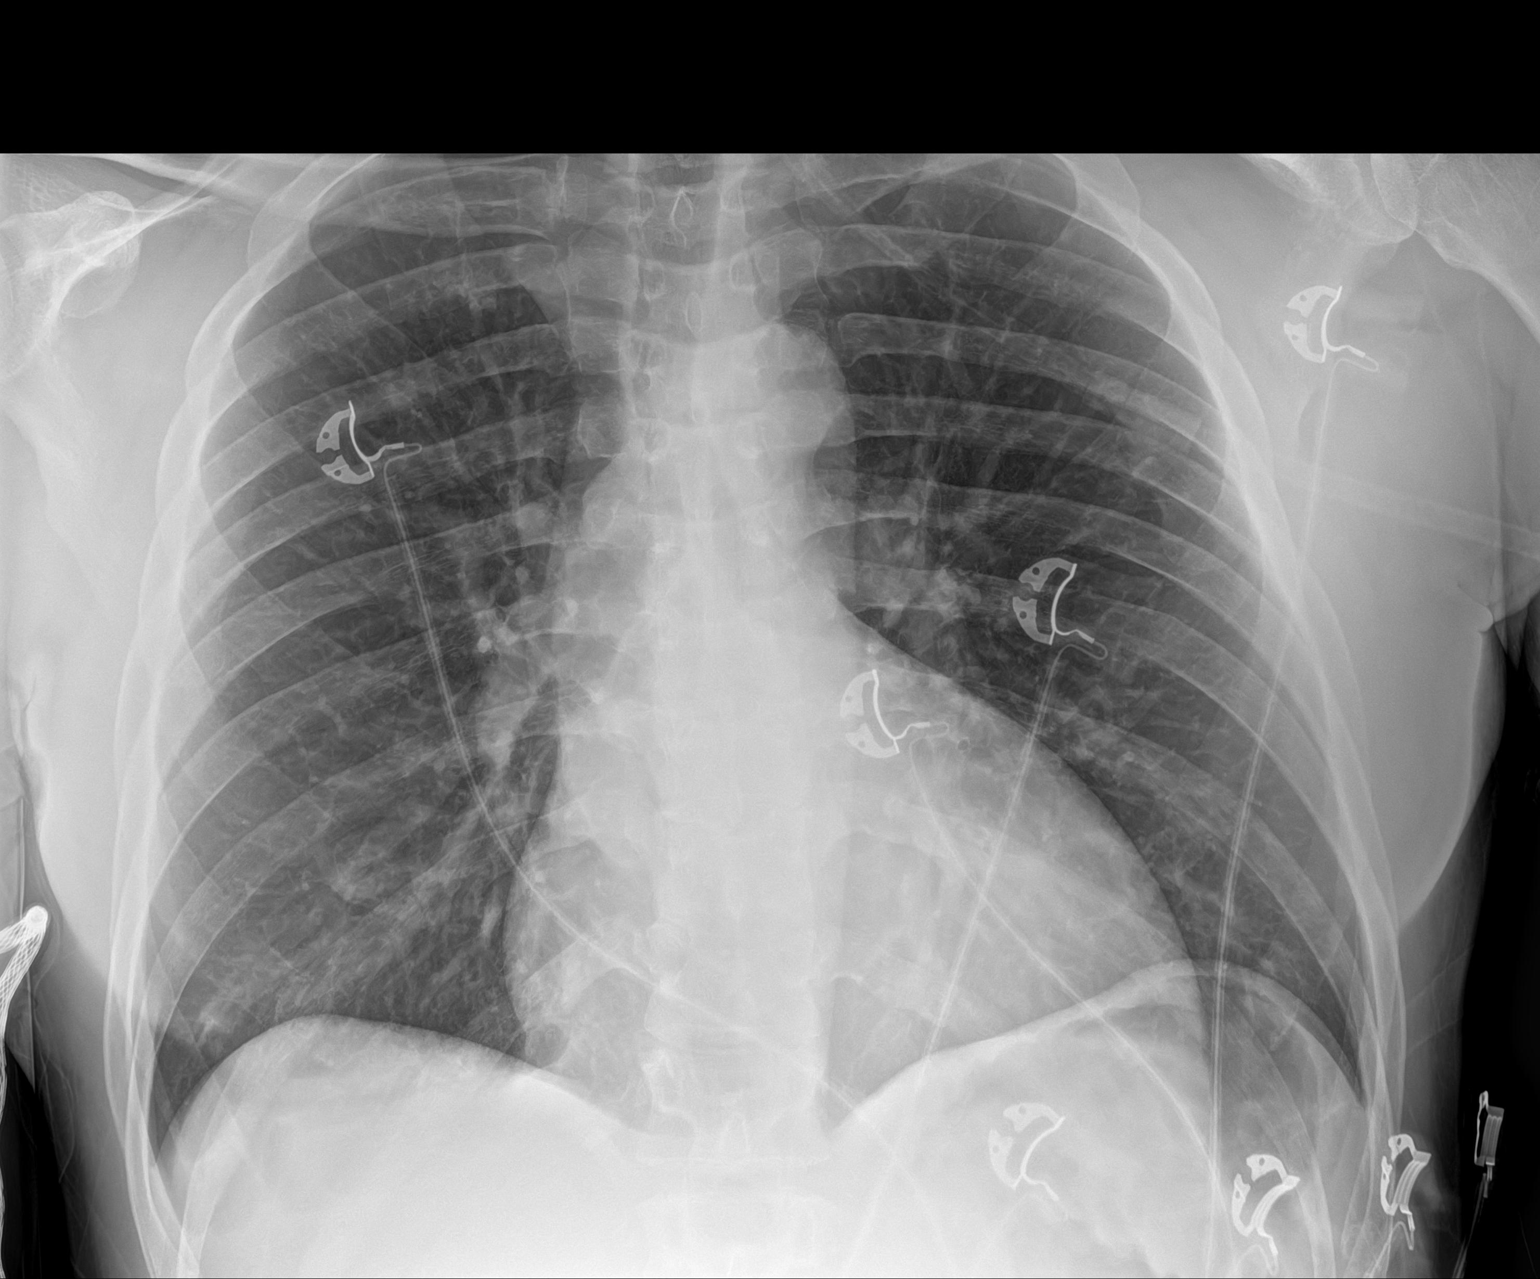

[1 of 1 positions shown; findings below may reference images not displayed]

FINDINGS: The lungs are clear without focal pneumonia, edema, pneumothorax or
pleural effusion. Cardiopericardial silhouette is at upper limits of
normal for size. The visualized bony structures of the thorax are
unremarkable. Telemetry leads overlie the chest.
IMPRESSION: No active disease.
